# Patient Record
Sex: Female | Born: 1935 | Race: White | Hispanic: No | Smoking: Former smoker
Health system: Southern US, Community
[De-identification: ages and names within clinical notes are randomized; demographics above are authoritative.]

## PROBLEM LIST (undated history)

## (undated) DIAGNOSIS — F0390 Unspecified dementia without behavioral disturbance: Secondary | ICD-10-CM

## (undated) DIAGNOSIS — W19XXXA Unspecified fall, initial encounter: Secondary | ICD-10-CM

## (undated) DIAGNOSIS — D649 Anemia, unspecified: Secondary | ICD-10-CM

## (undated) DIAGNOSIS — G500 Trigeminal neuralgia: Secondary | ICD-10-CM

## (undated) DIAGNOSIS — R296 Repeated falls: Secondary | ICD-10-CM

## (undated) HISTORY — PX: VAGINAL HYSTERECTOMY: SUR661

## (undated) HISTORY — PX: TONSILLECTOMY: SUR1361

## (undated) HISTORY — PX: PARTIAL HIP ARTHROPLASTY: SHX733

## (undated) HISTORY — PX: APPENDECTOMY: SHX54

## (undated) HISTORY — DX: Anemia, unspecified: D64.9

## (undated) HISTORY — DX: Unspecified dementia, unspecified severity, without behavioral disturbance, psychotic disturbance, mood disturbance, and anxiety: F03.90

---

## 2004-07-14 ENCOUNTER — Ambulatory Visit: Payer: Self-pay | Admitting: Family Medicine

## 2006-11-22 ENCOUNTER — Ambulatory Visit: Payer: Self-pay | Admitting: Family Medicine

## 2007-12-07 ENCOUNTER — Ambulatory Visit: Payer: Self-pay | Admitting: Family Medicine

## 2007-12-09 ENCOUNTER — Ambulatory Visit: Payer: Self-pay | Admitting: Family Medicine

## 2008-12-10 ENCOUNTER — Ambulatory Visit: Payer: Self-pay | Admitting: Family Medicine

## 2010-06-13 ENCOUNTER — Ambulatory Visit: Payer: Self-pay | Admitting: Family Medicine

## 2011-05-04 ENCOUNTER — Inpatient Hospital Stay: Payer: Self-pay | Admitting: General Practice

## 2011-05-08 ENCOUNTER — Encounter: Payer: Self-pay | Admitting: Internal Medicine

## 2011-05-22 ENCOUNTER — Ambulatory Visit: Payer: Self-pay | Admitting: Internal Medicine

## 2011-05-30 ENCOUNTER — Encounter: Payer: Self-pay | Admitting: Internal Medicine

## 2011-12-16 ENCOUNTER — Ambulatory Visit: Payer: Self-pay | Admitting: Family Medicine

## 2012-08-02 IMAGING — CT CT HEAD WITHOUT CONTRAST
1 series · 16 of 29 positions shown, 20 images · non-contrast
Comparison: none

REASON FOR EXAM: Syncope
COMMENTS:

[Series 602: soft (id) · axial · 0.39mm/px · z∈[+654,+784]mm · 16 of 29 slices shown, 20 images]
[im 2/29  brain]
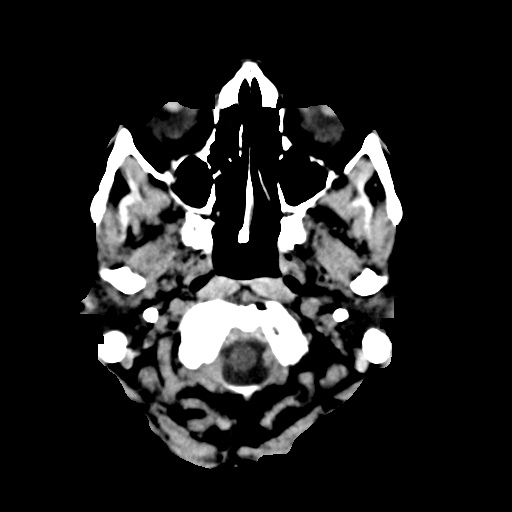
[im 2/29  bone]
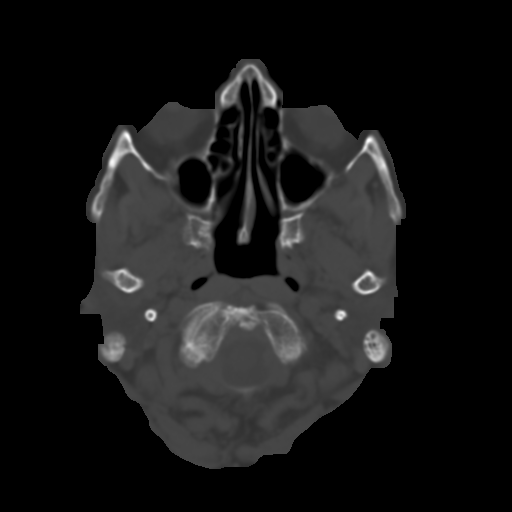
[im 4/29  brain]
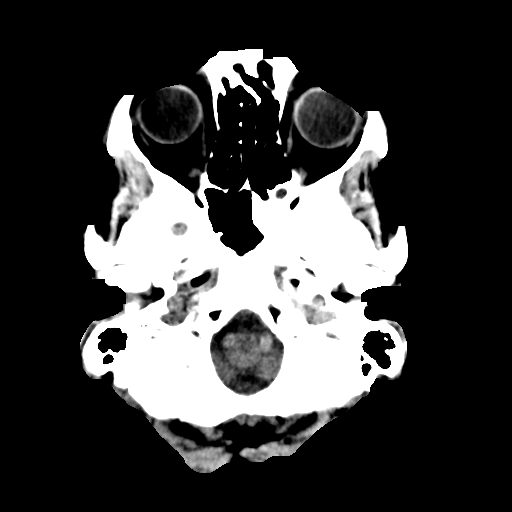
[im 6/29  brain]
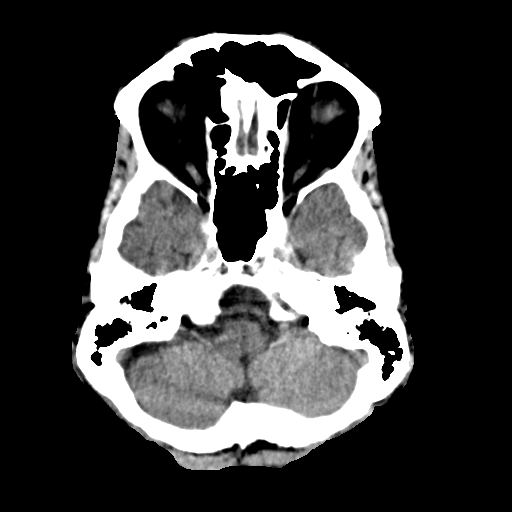
[im 7/29  brain]
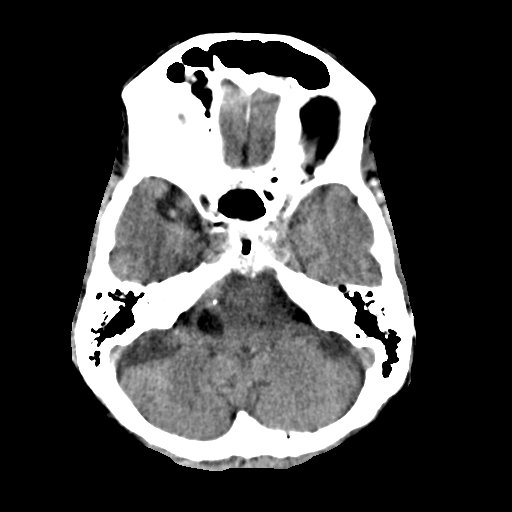
[im 9/29  brain]
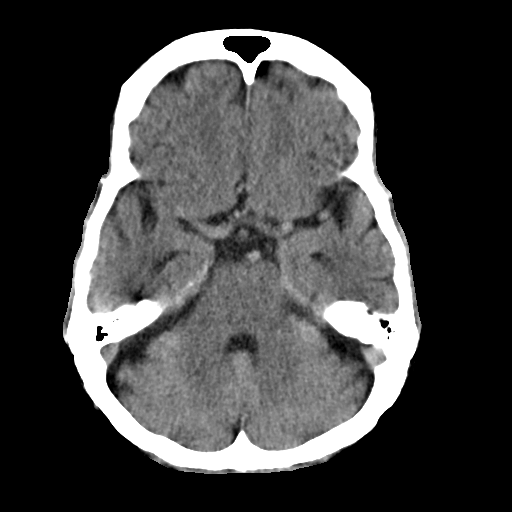
[im 9/29  bone]
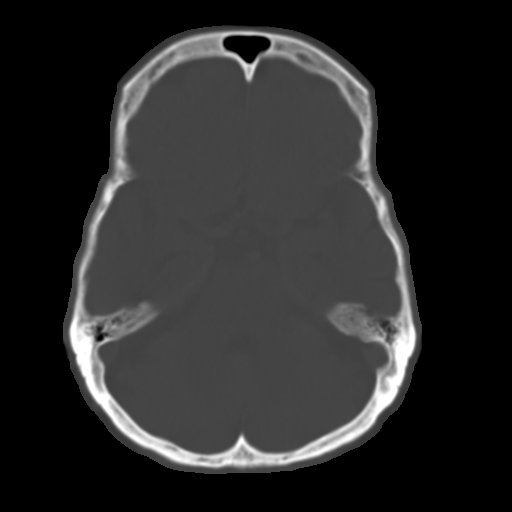
[im 11/29  brain]
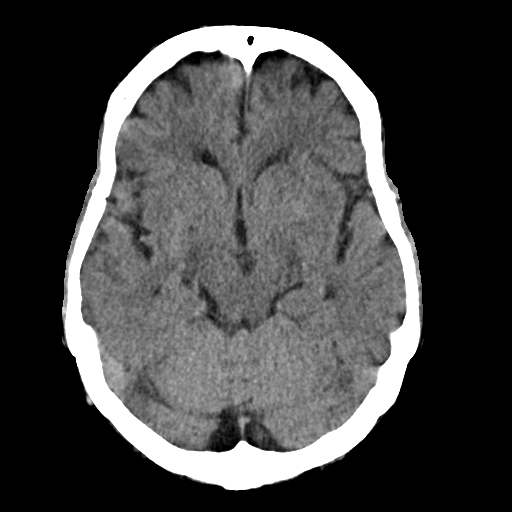
[im 12/29  brain]
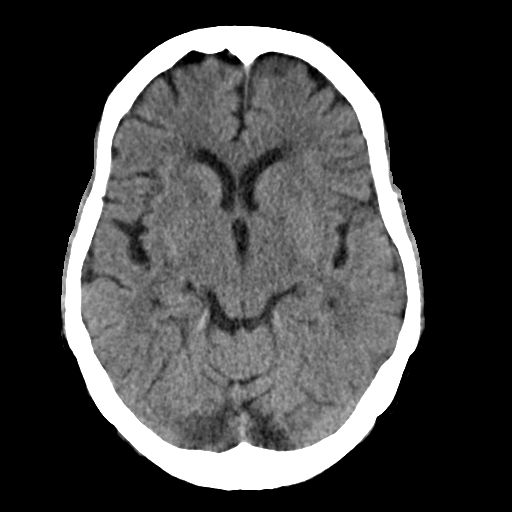
[im 14/29  brain]
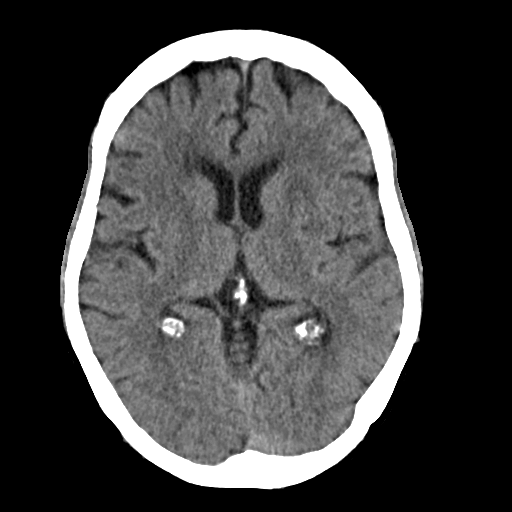
[im 16/29  brain]
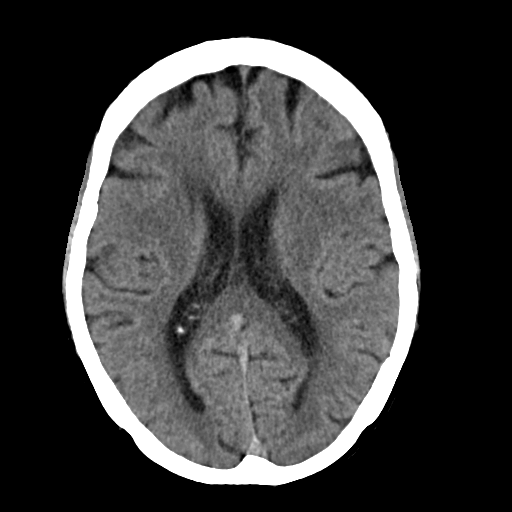
[im 16/29  bone]
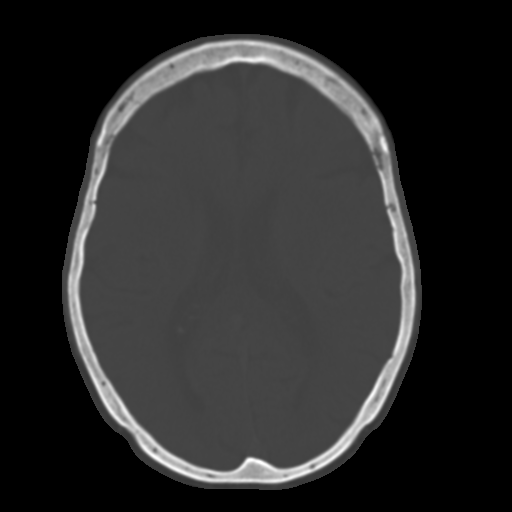
[im 18/29  brain]
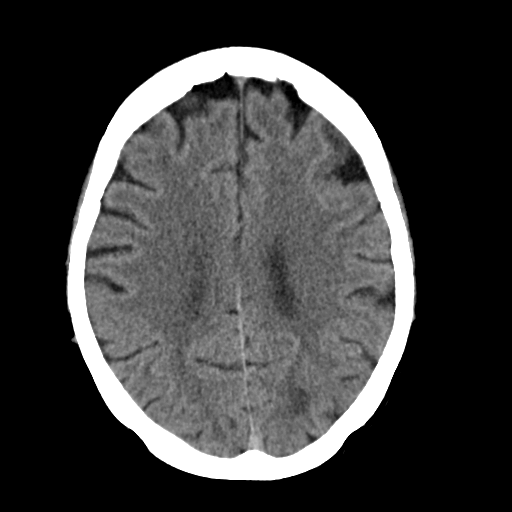
[im 19/29  brain]
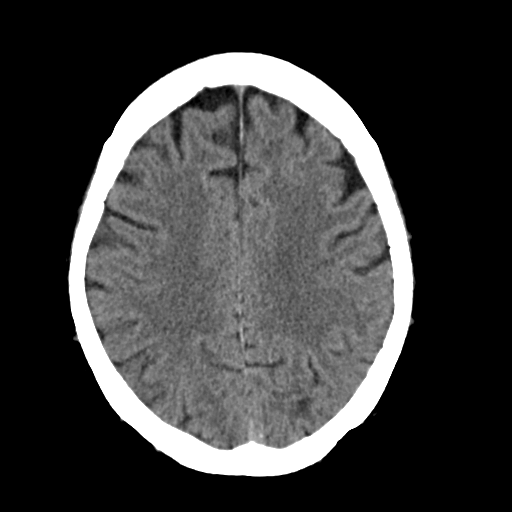
[im 21/29  brain]
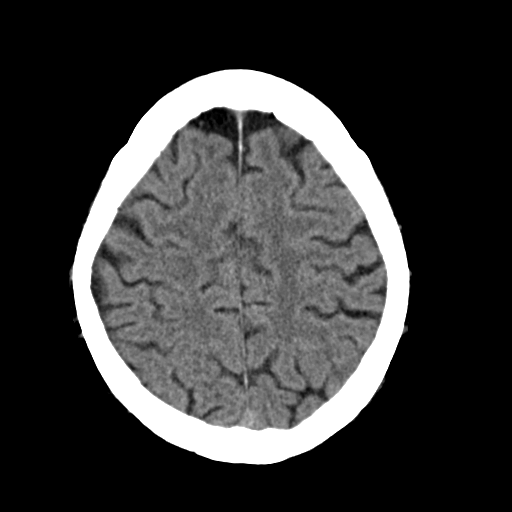
[im 23/29  brain]
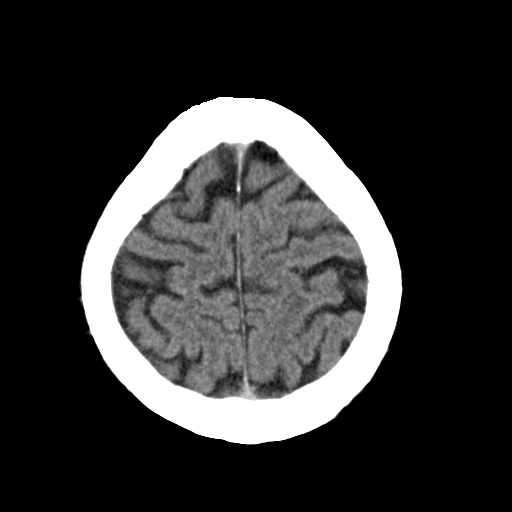
[im 23/29  bone]
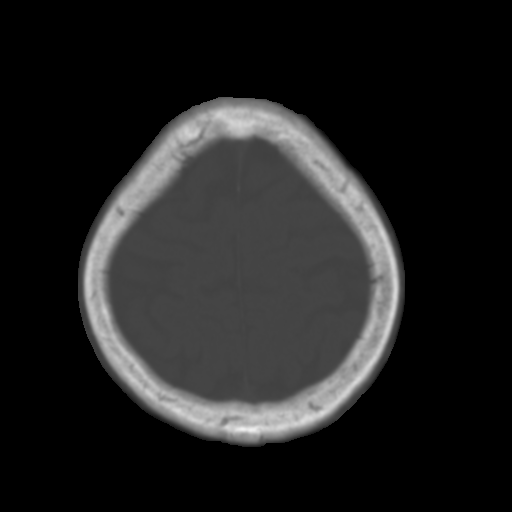
[im 24/29  brain]
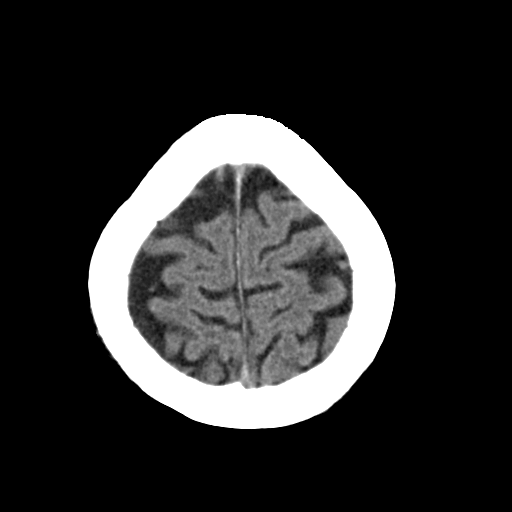
[im 26/29  brain]
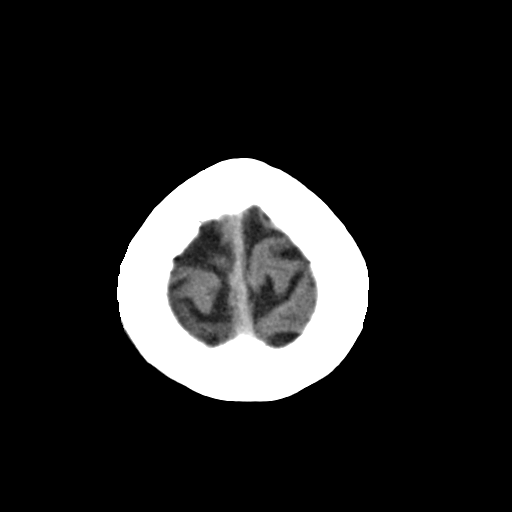
[im 28/29  brain]
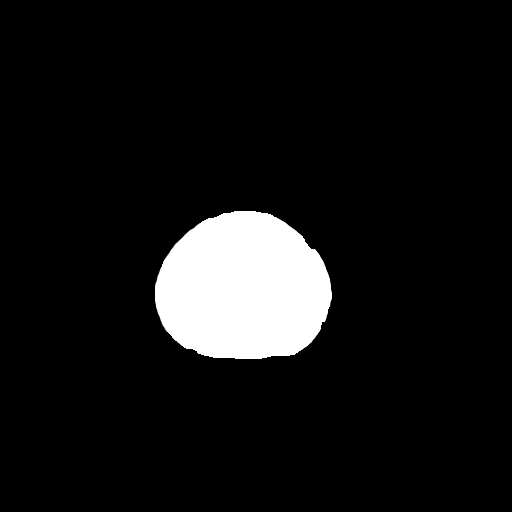

[16 of 29 positions shown; findings below may reference images not displayed]

PROCEDURE:     KARYN - ANN KATO WITHOUT CONTRAST  - December 16, 2011  [DATE]

RESULT:     CT of the brain without contrast is performed in the standard
fashion. Images demonstrate prominence of the ventricles and sulci
consistent with atrophy within normal limits for the patient's age.
Low-attenuation is seen in the periventricular and subcortical white matter
regions most consistent with chronic small vessel ischemic disease. There is
no evidence of intracranial hemorrhage, mass or mass effect. No midline
shift is evident. The included paranasal sinuses and mastoid air cells show
normal-appearing aeration. The calvarium is intact.
IMPRESSION: 1.     Changes of atrophy with chronic small vessel ischemic disease.
2.     No acute intracranial abnormality evident. MRI of the brain would be
recommended if the patient has persistent or worsening symptoms.

## 2012-10-13 ENCOUNTER — Ambulatory Visit: Payer: Self-pay

## 2012-10-23 ENCOUNTER — Ambulatory Visit: Payer: Self-pay

## 2013-05-09 LAB — URINALYSIS, COMPLETE
Hyaline Cast: 7
Nitrite: NEGATIVE
Ph: 5 (ref 4.5–8.0)
Protein: 30
Squamous Epithelial: 3

## 2013-05-09 LAB — COMPREHENSIVE METABOLIC PANEL
BUN: 38 mg/dL — ABNORMAL HIGH (ref 7–18)
Bilirubin,Total: 0.4 mg/dL (ref 0.2–1.0)
Calcium, Total: 9.3 mg/dL (ref 8.5–10.1)
Chloride: 108 mmol/L — ABNORMAL HIGH (ref 98–107)
Co2: 25 mmol/L (ref 21–32)
Creatinine: 0.82 mg/dL (ref 0.60–1.30)
EGFR (African American): >60
EGFR (Non-African Amer.): >60
Osmolality: 292 (ref 275–301)
Potassium: 4.3 mmol/L (ref 3.5–5.1)
SGPT (ALT): 18 U/L (ref 12–78)
Total Protein: 6.4 g/dL (ref 6.4–8.2)

## 2013-05-09 LAB — CBC
HCT: 38.2 % (ref 35.0–47.0)
MCH: 29.7 pg (ref 26.0–34.0)
MCHC: 34 g/dL (ref 32.0–36.0)
Platelet: 416 10*3/uL (ref 150–440)
RBC: 4.37 10*6/uL (ref 3.80–5.20)
RDW: 13 % (ref 11.5–14.5)

## 2013-05-10 ENCOUNTER — Inpatient Hospital Stay: Payer: Self-pay | Admitting: Internal Medicine

## 2013-05-10 LAB — HEMOGLOBIN: HGB: 10.2 g/dL — ABNORMAL LOW (ref 12.0–16.0)

## 2013-05-10 LAB — HEMATOCRIT
HCT: 29.6 % — ABNORMAL LOW (ref 35.0–47.0)
HCT: 25.7 % — ABNORMAL LOW (ref 35.0–47.0)

## 2013-05-10 LAB — PROTIME-INR: INR: 1.1

## 2013-05-11 LAB — CBC WITH DIFFERENTIAL/PLATELET
Basophil #: 0.1 10*3/uL (ref 0.0–0.1)
Basophil #: 0.1 10*3/uL (ref 0.0–0.1)
Basophil %: 0.4 %
Eosinophil #: 0.5 10*3/uL (ref 0.0–0.7)
Eosinophil #: 0.4 10*3/uL (ref 0.0–0.7)
Eosinophil %: 4.4 %
Eosinophil %: 3.5 %
HCT: 23.4 % — ABNORMAL LOW (ref 35.0–47.0)
HCT: 19.5 % — ABNORMAL LOW (ref 35.0–47.0)
HGB: 6.6 g/dL — ABNORMAL LOW (ref 12.0–16.0)
Lymphocyte #: 3.4 10*3/uL (ref 1.0–3.6)
Lymphocyte %: 24.9 %
MCH: 30 pg (ref 26.0–34.0)
MCH: 29.9 pg (ref 26.0–34.0)
MCHC: 34.4 g/dL (ref 32.0–36.0)
MCHC: 34 g/dL (ref 32.0–36.0)
MCV: 88 fL (ref 80–100)
Monocyte #: 1.3 x10 3/mm — ABNORMAL HIGH (ref 0.2–0.9)
Neutrophil #: 7.9 10*3/uL — ABNORMAL HIGH (ref 1.4–6.5)
Neutrophil %: 61.3 %
Platelet: 317 10*3/uL (ref 150–440)
Platelet: 288 10*3/uL (ref 150–440)
RBC: 2.68 10*6/uL — ABNORMAL LOW (ref 3.80–5.20)
RBC: 2.22 10*6/uL — ABNORMAL LOW (ref 3.80–5.20)
WBC: 12 10*3/uL — ABNORMAL HIGH (ref 3.6–11.0)

## 2013-05-11 LAB — PROTIME-INR
INR: 1.2
Prothrombin Time: 15.3 secs — ABNORMAL HIGH (ref 11.5–14.7)

## 2013-05-11 LAB — BASIC METABOLIC PANEL
Anion Gap: 6 — ABNORMAL LOW (ref 7–16)
Calcium, Total: 8.2 mg/dL — ABNORMAL LOW (ref 8.5–10.1)
Co2: 25 mmol/L (ref 21–32)
Creatinine: 0.64 mg/dL (ref 0.60–1.30)
EGFR (African American): >60
EGFR (Non-African Amer.): >60
Glucose: 104 mg/dL — ABNORMAL HIGH (ref 65–99)
Osmolality: 294 (ref 275–301)
Potassium: 3.9 mmol/L (ref 3.5–5.1)

## 2013-05-11 LAB — MAGNESIUM: Magnesium: 1.7 mg/dL — ABNORMAL LOW

## 2013-05-11 LAB — TSH: Thyroid Stimulating Horm: <0.01 u[IU]/mL — ABNORMAL LOW

## 2013-05-11 LAB — T4, FREE: Free Thyroxine: 1.54 ng/dL — ABNORMAL HIGH (ref 0.76–1.46)

## 2013-05-11 LAB — HEMOGLOBIN: HGB: 7.2 g/dL — ABNORMAL LOW (ref 12.0–16.0)

## 2013-05-12 LAB — CBC WITH DIFFERENTIAL/PLATELET
Basophil #: 0.1 10*3/uL (ref 0.0–0.1)
Eosinophil #: 0.9 10*3/uL — ABNORMAL HIGH (ref 0.0–0.7)
Eosinophil %: 7.7 %
HCT: 24.6 % — ABNORMAL LOW (ref 35.0–47.0)
Lymphocyte #: 3.2 10*3/uL (ref 1.0–3.6)
MCH: 30.8 pg (ref 26.0–34.0)
MCHC: 35.3 g/dL (ref 32.0–36.0)
MCV: 87 fL (ref 80–100)
Neutrophil %: 55.8 %
Platelet: 214 10*3/uL (ref 150–440)
RBC: 2.82 10*6/uL — ABNORMAL LOW (ref 3.80–5.20)
RDW: 13.6 % (ref 11.5–14.5)
WBC: 12.1 10*3/uL — ABNORMAL HIGH (ref 3.6–11.0)

## 2013-05-12 LAB — MAGNESIUM: Magnesium: 1.7 mg/dL — ABNORMAL LOW

## 2013-05-12 LAB — HEMATOCRIT
HCT: 22.1 % — ABNORMAL LOW (ref 35.0–47.0)
HCT: 20.3 % — ABNORMAL LOW (ref 35.0–47.0)

## 2013-05-12 LAB — HEMOGLOBIN
HGB: 8.4 g/dL — ABNORMAL LOW (ref 12.0–16.0)
HGB: 7.8 g/dL — ABNORMAL LOW (ref 12.0–16.0)

## 2013-05-13 LAB — HEMATOCRIT: HCT: 17.9 % — ABNORMAL LOW (ref 35.0–47.0)

## 2013-05-13 LAB — URINE CULTURE

## 2013-05-14 LAB — CBC WITH DIFFERENTIAL/PLATELET
Basophil #: 0.1 10*3/uL (ref 0.0–0.1)
Eosinophil %: 5.1 %
HGB: 8.4 g/dL — ABNORMAL LOW (ref 12.0–16.0)
Lymphocyte #: 2 10*3/uL (ref 1.0–3.6)
Lymphocyte %: 16.2 %
MCHC: 35.3 g/dL (ref 32.0–36.0)
MCV: 90 fL (ref 80–100)
Monocyte #: 1.2 x10 3/mm — ABNORMAL HIGH (ref 0.2–0.9)
Neutrophil %: 68.5 %
Platelet: 209 10*3/uL (ref 150–440)
RBC: 2.65 10*6/uL — ABNORMAL LOW (ref 3.80–5.20)
RDW: 14 % (ref 11.5–14.5)
WBC: 12.5 10*3/uL — ABNORMAL HIGH (ref 3.6–11.0)

## 2013-05-14 LAB — BASIC METABOLIC PANEL
Anion Gap: 5 — ABNORMAL LOW (ref 7–16)
BUN: 21 mg/dL — ABNORMAL HIGH (ref 7–18)
Calcium, Total: 7.6 mg/dL — ABNORMAL LOW (ref 8.5–10.1)
EGFR (African American): >60
EGFR (Non-African Amer.): >60
Osmolality: 287 (ref 275–301)
Potassium: 3.2 mmol/L — ABNORMAL LOW (ref 3.5–5.1)
Sodium: 142 mmol/L (ref 136–145)

## 2013-05-14 LAB — MAGNESIUM: Magnesium: 1.7 mg/dL — ABNORMAL LOW

## 2013-05-15 LAB — CBC WITH DIFFERENTIAL/PLATELET
Basophil #: 0 10*3/uL (ref 0.0–0.1)
Basophil #: 0 10*3/uL (ref 0.0–0.1)
Basophil %: 0.2 %
Eosinophil %: 4.3 %
Eosinophil %: 1.8 %
HGB: 7.9 g/dL — ABNORMAL LOW (ref 12.0–16.0)
Lymphocyte #: 1.7 10*3/uL (ref 1.0–3.6)
Lymphocyte %: 14.1 %
MCH: 31.7 pg (ref 26.0–34.0)
MCH: 31.6 pg (ref 26.0–34.0)
MCV: 89 fL (ref 80–100)
MCV: 90 fL (ref 80–100)
Monocyte #: 1.2 x10 3/mm — ABNORMAL HIGH (ref 0.2–0.9)
Monocyte %: 9.8 %
Neutrophil #: 7.7 10*3/uL — ABNORMAL HIGH (ref 1.4–6.5)
Neutrophil %: 69.4 %
Neutrophil %: 74.1 %
RBC: 2.52 10*6/uL — ABNORMAL LOW (ref 3.80–5.20)
RDW: 14.5 % (ref 11.5–14.5)
WBC: 11 10*3/uL (ref 3.6–11.0)

## 2013-05-15 LAB — HEPATIC FUNCTION PANEL A (ARMC)
Albumin: 2.1 g/dL — ABNORMAL LOW (ref 3.4–5.0)
Alkaline Phosphatase: 69 U/L (ref 50–136)
Bilirubin,Total: 0.7 mg/dL (ref 0.2–1.0)
SGOT(AST): 15 U/L (ref 15–37)
SGPT (ALT): 11 U/L — ABNORMAL LOW (ref 12–78)
Total Protein: 4.1 g/dL — ABNORMAL LOW (ref 6.4–8.2)

## 2013-05-15 LAB — T4, FREE: Free Thyroxine: 1.58 ng/dL — ABNORMAL HIGH (ref 0.76–1.46)

## 2013-05-16 LAB — BASIC METABOLIC PANEL
Anion Gap: 7 (ref 7–16)
BUN: 9 mg/dL (ref 7–18)
Calcium, Total: 7.9 mg/dL — ABNORMAL LOW (ref 8.5–10.1)
Chloride: 112 mmol/L — ABNORMAL HIGH (ref 98–107)
Co2: 24 mmol/L (ref 21–32)
Creatinine: 0.49 mg/dL — ABNORMAL LOW (ref 0.60–1.30)
EGFR (African American): >60
Osmolality: 284 (ref 275–301)
Potassium: 3.2 mmol/L — ABNORMAL LOW (ref 3.5–5.1)
Sodium: 143 mmol/L (ref 136–145)

## 2013-05-17 LAB — HEMOGLOBIN
HGB: 7.3 g/dL — ABNORMAL LOW (ref 12.0–16.0)
HGB: 6.8 g/dL — ABNORMAL LOW

## 2013-05-17 LAB — BASIC METABOLIC PANEL
BUN: 10 mg/dL (ref 7–18)
Co2: 28 mmol/L (ref 21–32)
Sodium: 143 mmol/L (ref 136–145)

## 2013-05-18 ENCOUNTER — Ambulatory Visit: Payer: Self-pay | Admitting: Hematology and Oncology

## 2013-05-18 LAB — HEMOGLOBIN: HGB: 7.9 g/dL — ABNORMAL LOW (ref 12.0–16.0)

## 2013-05-18 LAB — T4, FREE: Free Thyroxine: 1.23 ng/dL (ref 0.76–1.46)

## 2013-08-01 ENCOUNTER — Ambulatory Visit: Payer: Self-pay

## 2013-08-01 LAB — HEPATIC FUNCTION PANEL A (ARMC)
Albumin: 3.5 g/dL (ref 3.4–5.0)
Alkaline Phosphatase: 124 U/L (ref 50–136)
Bilirubin,Total: 0.3 mg/dL (ref 0.2–1.0)
SGOT(AST): 17 U/L (ref 15–37)

## 2013-08-01 LAB — CBC WITH DIFFERENTIAL/PLATELET
Basophil #: 0 10*3/uL (ref 0.0–0.1)
Eosinophil #: 0.5 10*3/uL (ref 0.0–0.7)
Eosinophil %: 7.4 %
HCT: 44.7 % (ref 35.0–47.0)
HGB: 14.2 g/dL (ref 12.0–16.0)
Lymphocyte #: 1.7 10*3/uL (ref 1.0–3.6)
MCHC: 31.8 g/dL — ABNORMAL LOW (ref 32.0–36.0)
MCV: 88 fL (ref 80–100)
Monocyte #: 0.7 x10 3/mm (ref 0.2–0.9)
Monocyte %: 10.7 %
Neutrophil #: 3.7 10*3/uL (ref 1.4–6.5)
Neutrophil %: 55.3 %
Platelet: 319 10*3/uL (ref 150–440)
RBC: 5.06 10*6/uL (ref 3.80–5.20)

## 2013-08-01 LAB — T4, FREE: Free Thyroxine: 0.53 ng/dL — ABNORMAL LOW (ref 0.76–1.46)

## 2013-08-01 LAB — TSH: Thyroid Stimulating Horm: 20.4 u[IU]/mL — ABNORMAL HIGH

## 2015-01-18 NOTE — Consult Note (Signed)
Brief Consult Note: Diagnosis: Microcytic anemia secondary to GI Bleed.   Discussed with Attending MD.   Comments: Patient presented with hematemesis. Hb still < 8g/dl despite 4 units packed red cells. Concern for Gi Bleed. Would recommend colonoscopy to further evaluate bleeding. Iv Feraheme today. Would not plan on hematology evaluation unless colonoscopy negative.  Electronic Signatures: Antony Hasteamiah, Silvestre Mines S (MD)  (Signed 21-Aug-14 12:44)  Authored: Brief Consult Note   Last Updated: 21-Aug-14 12:44 by Antony Hasteamiah, Mychele Seyller S (MD)

## 2015-01-18 NOTE — Consult Note (Signed)
Chief Complaint:  Subjective/Chief Complaint Patient a drop in Hb and dark stools last night. no complaints. She had an EGD without a source found.   VITAL SIGNS/ANCILLARY NOTES: **Vital Signs.:   16-Aug-14 04:00  Vital Signs Type Routine  Pulse source if not from Vital Sign Device per cardiac monitor    09:35  Pulse source if not from Vital Sign Device per cardiac monitor   Brief Assessment:  GEN well developed, well nourished, no acute distress   Respiratory normal resp effort   Additional Physical Exam Alert and orientated times 3   Lab Results: Thyroid:  16-Aug-14 03:50   Thyroxine, Free  1.52 (Result(s) reported on 13 May 2013 at 04:42AM.)  Routine Hem:  16-Aug-14 03:50   Hematocrit (CBC)  17.9 (Result(s) reported on 13 May 2013 at 04:31AM.)  Hemoglobin (CBC)  6.3 (Result(s) reported on 13 May 2013 at 04:31AM.)   Assessment/Plan:  Assessment/Plan:  Assessment GI bleed.   Plan The patient will be set up for a repeat bleeding scan for today. She is being transfused at present.   Electronic Signatures: Midge MiniumWohl, Tisa Weisel (MD)  (Signed 16-Aug-14 11:33)  Authored: Chief Complaint, VITAL SIGNS/ANCILLARY NOTES, Brief Assessment, Lab Results, Assessment/Plan   Last Updated: 16-Aug-14 11:33 by Midge MiniumWohl, Damyia Strider (MD)

## 2015-01-18 NOTE — Discharge Summary (Signed)
PATIENT NAME:  Victoria Woods, Victoria Woods MR#:  [id number] DATE OF BIRTH:  1920/11/05  DATE OF ADMISSION:  05/10/2013 DATE OF DISCHARGE:  05/18/2013  Addendum to discharge summary dictated by Dr. Shaune PollackQing Chen on 05/16/2013.   FINAL DISCHARGE DIAGNOSES: 1.  Anemia, acute on chronic status post 4 units blood transfusion suspected from gastrointestinal bleed, etiology unclear.  2.  Senile dementia.  3.  Hyperthyroidism.   PROCEDURES:  1.  EGD negative.  2.  GI bleeding scan x2 negative for any active bleed.   CODE STATUS: NO CODE, DO NOT RESUSCITATE.   MEDICATIONS: 1.  Aricept 10 mg at bedtime.  2. ensure  p.o. daily.  3.  Methimazole 10 mg 2 tablets daily.  4.  Iron polysaccharide 150 mg p.o. daily.  5.  Protonix 40 mg daily.  6.  Ensure Clear 200 mL 3 times a day with meals.  7.  The patient advised not to take aspirin, Celebrex. Do not use Motrin or ibuprofen. Use Tylenol for pain.  8.  Home health physical therapy.  9.  Mechanical soft diet.   FOLLOWUP:  1.  Follow up with Dr. Shelle Ironein GI in 1 week.  2.  Follow up with Dr. Wendie Simmeramiah on 05/26/2013 at 2:00.  Hemoglobin at discharge was 7.9.   HOSPITAL COURSE:  From 05/17/2013 to 05/18/2013.  Victoria Woods is an 81 year old Caucasian female with history of dementia, brought in with:  1.  Acute on chronic anemia, which is suspected due to slow GI bleed. The patient did not have any active bleeding. She underwent endoscopy done by Dr. Shelle Ironein, did not show any abnormality.  The patient was continued on Protonix. She received 4 units of blood transfusion. Her exact etiology could not be determined. GI wants to hold off on colonoscopy due to age, dementia and other comorbidities and the patient did not have any active rectal bleed. She will be started on p.o. iron and follow up with Dr. Shelle Ironein in 1 week. If the patient's hemoglobin continues to stay on the lower side, GI may consider doing the colonoscopy as outpt. The patient was seen by Dr. Wendie Simmeramiah from  hematology.  Received IV Venofer for her anemia. She will also be followed by Dr. Wendie Simmeramiah as outpatient.  2.  Relative hypotension. Blood pressure was on the lower side, however, the patient remained asymptomatic.  3.  Hyperthyroidism. The patient was seen by Dr. Tedd SiasSolum. Her methimazole, she received a dose of 30 mg, and then 20 mg daily was given. She will follow up with Dr. Tedd SiasSolum as outpatient as well.  4.  History of chronic irregular heart rate. The patient remained in sinus rhythm.  5.  Senile dementia. The patient was resumed back on her Aricept. Discharge plan was discussed with the patient's daughter, Victoria Woods at length.   6.  Generalized weakness and debility. The patient was seen by physical therapy. Home health PT was recommended, which had been set up prior to discharge. Hospital stay otherwise remained stable.   CODE STATUS: The patient remained NO CODE, DO NOT RESUSCITATE.   TIME SPENT: 40 minutes.   ____________________________ Wylie HailSona A. Allena KatzPatel, MD sap:dp D: 05/19/2013 07:25:20 ET T: 05/19/2013 07:46:39 ET JOB#: [id number]  cc: Rada Zegers A. Allena KatzPatel, MD, <Dictator> Willow OraSONA A Dwon Sky MD ELECTRONICALLY SIGNED 06/01/2013 20:03

## 2015-01-18 NOTE — Consult Note (Signed)
Brief Consult Note: Diagnosis: upper GI bleed.   Patient was seen by consultant.   Consult note dictated.   Recommend to proceed with surgery or procedure.   Comments: Ms Larence PenningBullard had melena for 2 days, hematemesis x 1.   Her hgb dropped overnight, but her hemodynamics are stable.   Please cont PPI drip.  We will plan for EGD to investigate the source of the bleeding..  Electronic Signatures: Dow Adolphein, Matthew (MD)  (Signed 13-Aug-14 12:44)  Authored: Brief Consult Note   Last Updated: 13-Aug-14 12:44 by Dow Adolphein, Matthew (MD)

## 2015-01-18 NOTE — Consult Note (Signed)
PATIENT NAME:  Victoria Woods, Victoria Woods MR#:  [id number] DATE OF BIRTH:  17-Feb-1921  DATE OF CONSULTATION:  05/12/2013  REFERRING PHYSICIAN:  Max Sane, MD CONSULTING PHYSICIAN:  A. Lavone Orn, MD  CHIEF COMPLAINT: Low TSH.   HISTORY OF PRESENT ILLNESS: This is an 81 year old female seen in consultation at the request of Dr. Manuella Ghazi for abnormal thyroid function tests, specifically with a low TSH of 0.010 and an elevated free T4 of 1.54. The patient was hospitalized on 08/13 with complaints of melena. She was noted to be tachycardic with a pulse of 112 and had an initially normal hemoglobin, although after hydration it decreased to 8. She has undergone blood transfusion, upper endoscopy, as well as a tagged red blood cell study. She has no known history of thyroid disease. She denies a family history of thyroid disease. She denies any complaints of feeling unwell at this time. She denies neck pain. No fever. No heat intolerance. Denies heart racing or palpitations. Denies tremor. Denies any weight loss. One daughter is at the bedside and this daughter is from [place]. She reports that mother is on Aricept for memory so I am not sure if she is a good historian.  PAST MEDICAL HISTORY: 1.  Possible dementia.  2.  Reported B12 deficiency.   PAST SURGICAL HISTORY: 1.  Tonsillectomy. 2.  Appendectomy. 3.  Hysterectomy.   ALLERGIES: No known drug allergies.   CURRENT INPATIENT MEDICATIONS:  1.  Protonix IV drip.  2.  Ciprofloxacin 250 mg q. 12 hours.  SOCIAL HISTORY: The patient lives with a friend. Apparently no history of tobacco or alcohol use.   FAMILY HISTORY: No known thyroid disease.   REVIEW OF SYSTEMS: GENERAL: No weight loss. No fever.  HEENT: No blurred vision. No sore throat.  NECK: Denies neck pain. Denies dysphasia.  CARDIAC: Denies chest pain. Denies palpitation.  PULMONARY: Denies cough. Denies shortness of breath.  ABDOMEN: Denies abdominal pain. She had had melena as per  HPI. Denies nausea.  EXTREMITIES: Denies swelling. Denies weakness.  GENITOURINARY: Denies dysuria. Denies hematuria. ENDOCRINE: Denies heat or cold intolerance.  HEME: Denies easy bruisability.  SKIN: Denies rash or recent skin changes.   PHYSICAL EXAMINATION: VITAL SIGNS: Height 65 inches, weight 120 pounds, BMI 20.1.  Temp 98.6, pulse 110, respirations 21, blood pressure 98/54.  GENERAL: White female, elderly, no acute distress and cooperative.  HEENT: No proptosis. No lid lag. EOMI. Oropharynx is clear.  NECK: Supple. No appreciable goiter, no neck tenderness, no palpable thyroid nodules.  CARDIAC: No carotid bruit, tachycardic.  PULMONARY: Clear to auscultation bilaterally. No wheeze.  ABDOMEN: Diffusely soft, nontender. She does have a ventral hernia.  EXTREMITIES: No edema is present.  SKIN: No rash or dermatopathy is noted.  NEUROLOGIC: There is no tremor of the outstretched hands.  MUSCULOSKELETAL: No weakness was elicited.  LABORATORY DATA: 05/11/2013: Glucose 104, BUN 36, creatinine 0.6, chloride 112. EGFR greater than 60. Magnesium 1.7. TSH less than 0.010. Free T4 is normal at 3.7.  05/12/2013: Hemoglobin 7.8, hematocrit 22.1.   ASSESSMENT: An 81 year old female with upper gastrointestinal bleed, persistent tachycardia, anemia, and low TSH and elevated free T4. Thyroid labs are consistent with hyperthyroidism.  Causes of hyperthyroidism in this individual could include silent thyroiditis, which we see in the setting of acute illness, Graves hyperthyroidism, or less likely would be a toxic adenoma or toxic multinodular goiter.   RECOMMENDATIONS: 1.  We will repeat a free T4 tomorrow as well as obtain a thyrotropin receptor antibody  and total T3 level. In Graves hyperthyroidism would expect the free T4 and total T3 to be elevated and the thyrotropin receptor antibody to be elevated. So this would be helpful to elucidate the cause of her hyperthyroidism. If free T4 has improved  and T3 is not high, this may suggest more thyroiditis.  2.  Given persistent tachycardia, which again may be multifactorial and due to anemia, I will favor treating empirically now with methimazole. Will give 30 mg today and then 20 mg daily thereafter. Again, if labs do not support ongoing hyperthyroidism over the next couple of days, I may elect to DC this.  Thank you for the kind request for consultation. I will follow along with you.  ____________________________ A. Lavone Orn, MD ams:sb D: 05/12/2013 15:06:09 ET T: 05/12/2013 15:47:14 ET JOB#: [id number]  cc: A. Lavone Orn, MD, <Dictator> Sherlon Handing MD ELECTRONICALLY SIGNED 05/24/2013 16:36

## 2015-01-18 NOTE — Consult Note (Signed)
Chief Complaint:  Subjective/Chief Complaint The pateint has had only a small amount of stooling last night but it was black. The repeat bleeding scan was negative. The patient had a drop in Hb overnight and was transfused this am.   VITAL SIGNS/ANCILLARY NOTES: **Vital Signs.:   17-Aug-14 07:45  Vital Signs Type Routine  Temperature Source oral  Pulse source if not from Vital Sign Device per cardiac monitor  Systolic BP Systolic BP 90  Diastolic BP (mmHg) Diastolic BP (mmHg) 58  Mean BP 68  Pulse Ox % Pulse Ox % 96  Oxygen Delivery Room Air/ 21 %  Pulse Ox Heart Rate 102   Brief Assessment:  GEN well developed, well nourished, no acute distress   Respiratory normal resp effort   Additional Physical Exam Alert and orientated times 3   Assessment/Plan:  Assessment/Plan:  Assessment GI bleed. with drop in Hb.   Plan The patient has continued anemia with acute blood loss. The patient was transfused and her hb will be followed today. She may need to have a repeat EGD with push enteroscopy. Dr. Shelle Ironein will resume care tomorrow.   Electronic Signatures: Midge MiniumWohl, Estuardo Frisbee (MD)  (Signed 17-Aug-14 08:00)  Authored: Chief Complaint, VITAL SIGNS/ANCILLARY NOTES, Brief Assessment, Assessment/Plan   Last Updated: 17-Aug-14 08:00 by Midge MiniumWohl, Damarri Rampy (MD)

## 2015-01-18 NOTE — H&P (Signed)
PATIENT NAME:  Victoria Woods, Victoria Woods MR#:  [id number] DATE OF BIRTH:  01/23/1921  DATE OF ADMISSION:  05/10/2013  PRIMARY CARE PHYSICIAN:  Dr. Elizabeth Sauereanna Jones.   REFERRING PHYSICIAN:  Dr. Zenda AlpersWebster.   CHIEF COMPLAINT:  Vomiting blood and black tarry stool.  HISTORY OF PRESENT ILLNESS:  The patient is an 81 year old Caucasian female apparently who is healthy, has been taking Celebrex for the past 9 to 10 days regarding degenerative joint disease and then noticed black tarry stool 3 to 5 times yesterday and started having vomiting blood.  Denies any kind of abdominal pain.  The patient and her friend are concerned and she is brought into the ER.  Hemoglobin is stable at 13 with a hematocrit of 38.3.  The patient is initiated on Protonix GTT.  The patient is tachycardic, initially hypotensive, but with IV fluids blood pressure is stabilized to 116/58.  During my examination, the patient denies any complaints.  Denies any abdominal pain.  Feeling okay.  Daughter and the patient's friend are at bedside.   PAST MEDICAL HISTORY:  Vitamin B12 deficiency.  Irregular heartbeat.   PAST SURGICAL HISTORY:  Tonsillectomy, appendectomy, hysterectomy.  ALLERGIES:  No known drug allergies.   PSYCHOSOCIAL HISTORY:  Lives at home with her friend.  Denies any history of smoking.  Occasional alcohol drinking.  Denies any street drugs.   FAMILY HISTORY:  Father deceased in motor vehicle accident.  Mom was healthy.   VITAL SIGNS:  Temperature 97.5 with pulse 106, respirations 18, blood pressure 116/58.   HOME MEDICATIONS:  Celebrex orally as needed, aspirin 325 mg once daily, Aricept 10 mg once daily.   REVIEW OF SYSTEMS: CONSTITUTIONAL:  Denies fever, fatigue.  EYES:  Denies blurry vision, glaucoma.  EARS, NOSE, THROAT:  No epistaxis, tinnitus.  RESPIRATION:  Denies cough, COPD.  CARDIOVASCULAR:  No chest pain, palpitations.  GASTROINTESTINAL:  Complaining of hematemesis, black tarry stool, but no abdominal pain.   GENITOURINARY:  No dysuria, hematuria.  GYNECOLOGIC AND BREAST:  Denies breast mass or vaginal discharge.  ENDOCRINE:  No polyuria, nocturia, thyroid problems.  HEMATOLOGIC AND LYMPHATIC:  No anemia or easy bruising.  Has hematemesis and hematochezia.  INTEGUMENTARY:  No acne, rash, lesions.  MUSCULOSKELETAL:  No joint pain in the neck.  Denies gout.  NEUROLOGIC:  No history of vertigo or ataxia.  PSYCHIATRIC:  No ADD, OCD.   PHYSICAL EXAMINATION: VITAL SIGNS:  Temperature 97.5, pulse 112, respirations 18, blood pressure 113/74, pulse ox 94%.  GENERAL APPEARANCE:  Not under acute distress.  Moderately built and nourished.  HEENT:  Normocephalic, atraumatic.  Pupils are equal, reacting light and accommodation.  No scleral icterus.  No conjunctival injection.  No sinus tenderness.  No postnasal drip.  NECK:  Supple.  No JVD.  No thyromegaly.  No lymphadenopathy.  Range of motion is intact.  LUNGS:  Clear to auscultation bilaterally.  No accessory muscle usage.  No anterior chest wall tenderness on palpation.  CARDIAC:  S1, S2 normal.  Regular rate and rhythm, tachycardic.  GASTROINTESTINAL:  Soft.  Bowel sounds are positive in all four quadrants.  Nontender, nondistended.  No hepatosplenomegaly.  No rebound tenderness.  NEUROLOGIC:  Awake, alert, oriented x 3.  Motor and sensory grossly intact.  Reflexes are 2+.  Cranial nerves II through XII are grossly intact.  EXTREMITIES:  No edema.  No cyanosis.  No clubbing.  SKIN:  Warm to touch.  Normal turgor.  No rashes.  No lesions.   PSYCHIATRIC:  Normal  mood and affect.  RECTAL:  Deferred as it was done by the ER physician and as reported by her.  The patient has heme-positive stool.   LABORATORY AND IMAGING STUDIES:  Glucose 127, BUN 38, creatinine 0.82, sodium 141, potassium 4.3, chloride 108, CO2 25, GFR greater than 60, serum osmolality 292, calcium 9.3, troponin less than 0.02.  WBC 14.1, hemoglobin 13.0, hematocrit 38.2, platelets of 416.   LFTs are within normal range except albumin which is low at 3.3.  Urinalysis yellow in color, hazy in appearance, leukocyte esterase 2 +, nitrite negative.   ASSESSMENT AND PLAN:  An 81 year old Caucasian female presenting to the ER with a chief complaint of vomiting blood and black tarry stool for the past one day, will be admitted with the following assessment and plan.  1.  Upper gastrointestinal bleed, probably from taking Celebrex continuously for the past 10 days.  We will keep her nothing by mouth, admit her to CCU.  We will provide her IV fluids, Protonix drip.  Gastroenterology consult is placed to Dr. Dow Adolph, called and notified.  Typing and screening is done.  We will monitor hemoglobin and hematocrit q. 8 hours.  2.  Acute cystitis.  Urine culture is ordered and IV ciprofloxacin will be provided.  3.  Vitamin B12 deficiency.  The patient being nothing by mouth we will hold off on vitamin B12 supplements at this time.  4.  Chronic history of irregular heartbeat.  The patient is in sinus rhythm now.  5.  We will provide her gastrointestinal prophylaxis with Protonix drip and deep vein thrombosis prophylaxis with SCDs.   Diagnosis and plan of care was discussed in detail with the patient and her daughter at bedside.  They both verbalized understanding of the plan.   CODE STATUS:  She is FULL CODE.  Daughter is medical power of attorney.   Critical care time spent is 50 minutes.    ____________________________ Ramonita Lab, MD ag:ea D: 05/10/2013 05:36:38 ET T: 05/10/2013 06:01:17 ET JOB#: [id number]  cc: Ramonita Lab, MD, <Dictator> Dow Adolph, MD Duanne Limerick, MD  Ramonita Lab MD ELECTRONICALLY SIGNED 05/12/2013 6:49

## 2015-01-18 NOTE — Consult Note (Signed)
PATIENT NAME:  Victoria Woods, Victoria Woods MR#:  [id number] DATE OF BIRTH:  1920-11-07  DATE OF CONSULTATION:  05/10/2013  REFERRING PHYSICIAN:  Dr. Ramonita Lab CONSULTING PHYSICIAN:  Dow Adolph, MD  REASON FOR THE CONSULT: Melena, hematemesis.   HISTORY OF PRESENT ILLNESS: Victoria Woods is an 81 year old female with a past medical history notable for B12 deficiency who presented to the Emergency Room for two days of melena and one episode of hematemesis. This this history is both per Victoria Woods and per the caretaker that lives with her. Over the past two days,  she has had about 2 to 3 episodes of dark stool episodes per day. Also last night before coming to the Emergency Room, she had one episode of coffee ground emesis. This was what prompted them to come to the Emergency Room. She has not had any prior trouble with GI bleeding. She has never had an upper endoscopy. She does not have any trouble with heartburn or reflux. Of note, she was has been taking Celebrex for joint pain over the approximately the past 10 days.   In the Emergency Room, she was noted to be mildly tachycardic, but responded to IV fluids. She was noted to have a normal hemoglobin of 13, which did drop significantly to  10 this morning.   PAST MEDICAL HISTORY:  1.  B12 deficiency.  2.  Irregular heartbeat.   PAST SURGICAL HISTORY:  1.  Tonsillectomy.  2.  Appendectomy.  3.  Hysterectomy.   ALLERGIES:  NKDA.   HOME MEDICATIONS: Celebrex.   SOCIAL HISTORY: She denies any history of tobacco, alcohol or recreational drugs.   FAMILY HISTORY: She denies any family history of colon cancer or any GI malignancy.   REVIEW OF SYSTEMS:   CONSTITUTIONAL: No weight gain or weight loss.  No fever or chills. HEENT: No oral lesions or sore throat. No vision changes. GASTROINTESTINAL: See HPI.  HEME/LYMPH: No easy bruising or bleeding. CARDIOVASCULAR: No chest pain or dyspnea on exertion. GENITOURINARY: No hematuria. INTEGUMENTARY: No  rashes or pruritus PSYCHIATRIC: No depression/anxiety.  ENDOCRINE: No heat/cold intolerance, no hair loss or skin changes. ALLERGIC/IMMUNOLOGIC: Negative for hives. RESPIRATORY: No cough, no shortness of breath.  MUSCULOSKELETAL: No joint swelling or muscle pain.   PHYSICAL EXAMINATION:   VITAL SIGNS:  Temperature is 97.9, pulse is 106, respirations are 18, blood pressure is 123/56. She is 97% on room air.  GENERAL: Alert and oriented times 2.  No acute distress. Appears stated age. HEENT: Normocephalic/atraumatic. Extraocular movements are intact. Anicteric. NECK: Soft, supple. JVP appears normal. No adenopathy. CHEST: Clear to auscultation. No wheeze or crackle. Respirations unlabored. HEART: Regular. No murmur, rub or gallop.  Normal S1 and S2. ABDOMEN: Soft, nontender, nondistended.  Normal active bowel sounds in all four quadrants.  No organomegaly. No masses EXTREMITIES: No swelling, well perfused. SKIN: No rash or lesion. Skin color, texture, turgor normal. NEUROLOGICAL: Grossly intact. PSYCHIATRIC: Normal tone and affect. MUSCULOSKELETAL: No joint swelling or erythema.  RECTAL:  c/w melena   LABORATORY DATA:  Her sodium 141 potassium 4.3, chloride 108, bicarbonate 25, BUN 38, creatinine 0.82, albumin 3.3, total bilirubin 0.4, alkaline phosphatase 104, AST 19, ALT 18. White count is 14, hemoglobin 13, hematocrit 38, which has gone to 10 at 7:50 this morning and then 9 at 2:55. Her INR is 1.1. Her platelets are 416.   ASSESSMENT AND PLAN: 1.  Melena, hematemesis: It seems as though she has stabilized at that this time. Her hemodynamics are stable. However,  her hemoglobin is dropping, which may reflect previous blood loss and equilibration  with fluids. We will plan to perform in an upper endoscopy to investigate for the source of the bleeding and potentially for treatment options. In the meantime please continue a proton pump inhibitor drip. Please also continue to monitor  hemodynamics and hemoglobin. We will have further recommendations pending the findings on the upper endoscopy. Please also go ahead and check a H. pylori serology.    ____________________________ Dow AdolphMatthew Rein, MD mr:cc D: 05/10/2013 16:15:10 ET T: 05/10/2013 16:59:07 ET JOB#: [id number]  cc: Dow AdolphMatthew Rein, MD, <Dictator> Kathalene FramesMATTHEW G REIN MD ELECTRONICALLY SIGNED 05/12/2013 15:53

## 2015-01-18 NOTE — Consult Note (Signed)
Chief Complaint and History:  Primary Physician Dr. Elizabeth Sauereanna Jones   Referring Physician Dr. Sherryll BurgerShah   Chief Complaint Low TSH   Allergies:  No Known Allergies:   Assessment/Plan:  Assessment/Plan [age] yo F admitted for melena with presumed upper GI bleed noted to have persistent tachycardia, TSH low at <0.010 and high free T4 of 1.54. She has no known personal or family h/o thyroid disease. No h/o iodinated contrsst dye, exposure to lithium, amiodarone, or glucocorticoids. She denies neck pain and fever. Otherwise feels well and no: tremor, palpitations, heat intolerance, or weight loss. Exam notable for no proptosis or eye changes and no goiter or palpable thyroid nodules. She remains tachy with HR in the 115 range.  A/ Hyperthyroidism - acute thyroiditis versus Grave's hyperthyroidism versus toxic MNG or adenoma.  P/ Will check TrAb which tends to be elevated in Grave's Start methimazole- 30 mg now and then 20 mg qd Repeat free T4 and get total T3 tomorrow  Full consult will be dictated.   Case Discussed With patient, daughter   Electronic Signatures: Raj JanusSolum, Lunah Losasso M (MD)  (Signed 15-Aug-14 14:50)  Authored: Chief Complaint and History, ALLERGIES, Assessment/Plan   Last Updated: 15-Aug-14 14:50 by Raj JanusSolum, Joelene Barriere M (MD)

## 2015-07-17 ENCOUNTER — Other Ambulatory Visit: Payer: Self-pay | Admitting: Family Medicine

## 2015-08-19 ENCOUNTER — Other Ambulatory Visit: Payer: Self-pay | Admitting: Family Medicine

## 2015-09-02 ENCOUNTER — Other Ambulatory Visit: Payer: Self-pay

## 2015-09-16 ENCOUNTER — Ambulatory Visit (INDEPENDENT_AMBULATORY_CARE_PROVIDER_SITE_OTHER): Payer: Medicare Other | Admitting: Family Medicine

## 2015-09-16 ENCOUNTER — Ambulatory Visit
Admission: RE | Admit: 2015-09-16 | Discharge: 2015-09-16 | Disposition: A | Discharge status: Home / Self Care | Payer: Medicare Other | Source: Ambulatory Visit | Attending: Family Medicine | Admitting: Family Medicine

## 2015-09-16 VITALS — BP 120/62 | HR 80 | Temp 98.0°F | Ht 66.0 in | Wt 120.0 lb

## 2015-09-16 DIAGNOSIS — Z87891 Personal history of nicotine dependence: Secondary | ICD-10-CM | POA: Insufficient documentation

## 2015-09-16 DIAGNOSIS — J189 Pneumonia, unspecified organism: Secondary | ICD-10-CM | POA: Diagnosis present

## 2015-09-16 DIAGNOSIS — J181 Lobar pneumonia, unspecified organism: Principal | ICD-10-CM

## 2015-09-16 DIAGNOSIS — D509 Iron deficiency anemia, unspecified: Secondary | ICD-10-CM

## 2015-09-16 MED ORDER — LEVOFLOXACIN 500 MG PO TABS: 500.0000 mg | ORAL_TABLET | Freq: Every day | ORAL | Status: DC

## 2015-09-16 NOTE — Progress Notes (Signed)
Name: Victoria Woods   MRN: [id number]    DOB: 1920-12-15   Date:09/16/2015       Progress Note  Subjective  Chief Complaint  Chief Complaint  Patient presents with  . Anemia  . Dementia  . Sinusitis    cough and cong    Anemia Presents for follow-up visit. There has been no abdominal pain, anorexia, bruising/bleeding easily, confusion, fever, leg swelling, light-headedness, malaise/fatigue, pallor, palpitations, paresthesias, pica or weight loss. Signs of blood loss that are not present include hematochezia and melena. Past treatments include oral iron supplements. There is no history of heart failure or malnutrition.  Sinusitis This is a new problem. The current episode started in the past 7 days. There has been no fever. Associated symptoms include coughing. Pertinent negatives include no chills, diaphoresis, ear pain, headaches, hoarse voice, neck pain, shortness of breath, sinus pressure or sore throat. Past treatments include acetaminophen. The treatment provided no relief.  Cough This is a new problem. The current episode started in the past 7 days. The problem has been waxing and waning. The cough is non-productive. Associated symptoms include nasal congestion. Pertinent negatives include no chest pain, chills, ear pain, fever, headaches, heartburn, hemoptysis, myalgias, postnasal drip, rash, sore throat, shortness of breath, weight loss or wheezing. The treatment provided mild relief. There is no history of bronchiectasis, COPD or environmental allergies.    No problem-specific assessment & plan notes found for this encounter.   No past medical history on file.  No past surgical history on file.  No family history on file.  Social History   Social History  . Marital Status: Widowed    Spouse Name: N/A  . Number of Children: N/A  . Years of Education: N/A   Occupational History  . Not on file.   Social History Main Topics  . Smoking status: Not on file  .  Smokeless tobacco: Not on file  . Alcohol Use: Not on file  . Drug Use: Not on file  . Sexual Activity: Not on file   Other Topics Concern  . Not on file   Social History Narrative  . No narrative on file    No Known Allergies   Review of Systems  Constitutional: Negative for fever, chills, weight loss, malaise/fatigue and diaphoresis.  HENT: Negative for ear discharge, ear pain, hoarse voice, postnasal drip, sinus pressure and sore throat.   Eyes: Negative for blurred vision.  Respiratory: Positive for cough. Negative for hemoptysis, sputum production, shortness of breath and wheezing.   Cardiovascular: Negative for chest pain, palpitations and leg swelling.  Gastrointestinal: Negative for heartburn, nausea, abdominal pain, diarrhea, constipation, blood in stool, melena, hematochezia and anorexia.  Genitourinary: Negative for dysuria, urgency, frequency and hematuria.  Musculoskeletal: Negative for myalgias, back pain, joint pain and neck pain.  Skin: Negative for pallor and rash.  Neurological: Negative for dizziness, tingling, sensory change, focal weakness, light-headedness, headaches and paresthesias.  Endo/Heme/Allergies: Negative for environmental allergies and polydipsia. Does not bruise/bleed easily.  Psychiatric/Behavioral: Negative for depression, suicidal ideas and confusion. The patient is not nervous/anxious and does not have insomnia.      Objective  Filed Vitals:   09/16/15 1414  BP: 120/62  Pulse: 80  Temp: 98 F (36.7 C)  TempSrc: Oral  Height:  (1.676 m)  Weight: 120 lb (54.432 kg)  SpO2: 95%    Physical Exam  Constitutional: She is well-developed, well-nourished, and in no distress. No distress.  HENT:  Head: Normocephalic and  atraumatic.  Right Ear: External ear normal.  Left Ear: External ear normal.  Nose: Nose normal.  Mouth/Throat: Oropharynx is clear and moist.  Eyes: Conjunctivae and EOM are normal. Pupils are equal, round, and  reactive to light. Right eye exhibits no discharge. Left eye exhibits no discharge.  Neck: Normal range of motion. Neck supple. No JVD present. No thyromegaly present.  Cardiovascular: Normal rate, regular rhythm, normal heart sounds and intact distal pulses.  Exam reveals no gallop and no friction rub.   No murmur heard. Pulmonary/Chest: Effort normal. No respiratory distress. She has no wheezes. She has rales.  Bibasilar rales  Abdominal: Soft. Bowel sounds are normal. She exhibits no mass. There is no tenderness. There is no guarding.  Musculoskeletal: Normal range of motion. She exhibits no edema.  Lymphadenopathy:    She has no cervical adenopathy.  Neurological: She is alert.  Skin: Skin is warm and dry. She is not diaphoretic.  Psychiatric: Mood and affect normal.  Nursing note and vitals reviewed.     Assessment & Plan  Problem List Items Addressed This Visit    None    Visit Diagnoses    Left lower lobe pneumonia    -  Primary    Relevant Medications    levofloxacin (LEVAQUIN) 500 MG tablet    Other Relevant Orders    DG Chest 2 View    CBC w/Diff/Platelet    Iron deficiency anemia        Relevant Medications    iron polysaccharides (FERREX 150) 150 MG capsule    Other Relevant Orders    CBC w/Diff/Platelet         Dr. Elizabeth Sauereanna Joelys Staubs [facility]  09/16/2015

## 2015-09-17 LAB — CBC WITH DIFFERENTIAL/PLATELET
BASOS: 0 %
Basophils Absolute: 0 10*3/uL (ref 0.0–0.2)
EOS (ABSOLUTE): 0.2 10*3/uL (ref 0.0–0.4)
Eos: 2 %
HEMATOCRIT: 42 % (ref 34.0–46.6)
Hemoglobin: 14.5 g/dL (ref 11.1–15.9)
IMMATURE GRANULOCYTES: 0 %
Immature Grans (Abs): 0 10*3/uL (ref 0.0–0.1)
LYMPHS ABS: 1.8 10*3/uL (ref 0.7–3.1)
Lymphs: 19 %
MCH: 30 pg (ref 26.6–33.0)
MCHC: 34.5 g/dL (ref 31.5–35.7)
MCV: 87 fL (ref 79–97)
MONOS ABS: 1.1 10*3/uL — AB (ref 0.1–0.9)
Monocytes: 11 %
NEUTROS PCT: 68 %
Neutrophils Absolute: 6.3 10*3/uL (ref 1.4–7.0)
Platelets: 279 10*3/uL (ref 150–379)
RBC: 4.84 x10E6/uL (ref 3.77–5.28)
RDW: 13.2 % (ref 12.3–15.4)
WBC: 9.4 10*3/uL (ref 3.4–10.8)

## 2015-09-19 ENCOUNTER — Ambulatory Visit (INDEPENDENT_AMBULATORY_CARE_PROVIDER_SITE_OTHER): Payer: Medicare Other | Admitting: Family Medicine

## 2015-09-19 ENCOUNTER — Encounter: Payer: Self-pay | Admitting: Family Medicine

## 2015-09-19 VITALS — BP 100/62 | HR 80 | Temp 98.1°F | Ht 66.0 in | Wt 120.0 lb

## 2015-09-19 DIAGNOSIS — J181 Lobar pneumonia, unspecified organism: Principal | ICD-10-CM

## 2015-09-19 DIAGNOSIS — J189 Pneumonia, unspecified organism: Secondary | ICD-10-CM

## 2015-09-19 DIAGNOSIS — F0390 Unspecified dementia without behavioral disturbance: Secondary | ICD-10-CM | POA: Diagnosis not present

## 2015-09-19 MED ORDER — DONEPEZIL HCL 10 MG PO TABS: ORAL_TABLET | ORAL | Status: DC

## 2015-09-19 NOTE — Progress Notes (Signed)
Name: Victoria AlbinoMargaret R Woods   MRN: [id number]    DOB: Apr 25, 1921   Date:09/19/2015       Progress Note  Subjective  Chief Complaint  Chief Complaint  Patient presents with  . URI    day 3 of Levaquin- "feels better" coughing spells still occur but not as bad as last visit    URI  The current episode started more than 1 year ago. The problem has been waxing and waning. Associated symptoms include coughing. Pertinent negatives include no abdominal pain, chest pain, congestion, diarrhea, dysuria, ear pain, headaches, joint pain, joint swelling, nausea, neck pain, plugged ear sensation, rash, rhinorrhea, sinus pain, sneezing, sore throat, swollen glands, vomiting or wheezing. The treatment provided mild relief.    No problem-specific assessment & plan notes found for this encounter.   Past Medical History  Diagnosis Date  . Anemia   . Dementia     Past Surgical History  Procedure Laterality Date  . Appendectomy    . Vaginal hysterectomy    . Tonsillectomy    . Partial hip arthroplasty Left     History reviewed. No pertinent family history.  Social History   Social History  . Marital Status: Widowed    Spouse Name: N/A  . Number of Children: N/A  . Years of Education: N/A   Occupational History  . Not on file.   Social History Main Topics  . Smoking status: Former Games developermoker  . Smokeless tobacco: Not on file  . Alcohol Use: No  . Drug Use: No  . Sexual Activity: Not on file   Other Topics Concern  . Not on file   Social History Narrative  . No narrative on file    No Known Allergies   Review of Systems  Constitutional: Negative for fever, chills, weight loss and malaise/fatigue.  HENT: Negative for congestion, ear discharge, ear pain, rhinorrhea, sneezing and sore throat.   Eyes: Negative for blurred vision.  Respiratory: Positive for cough. Negative for sputum production, shortness of breath and wheezing.   Cardiovascular: Negative for chest pain, palpitations  and leg swelling.  Gastrointestinal: Negative for heartburn, nausea, vomiting, abdominal pain, diarrhea, constipation, blood in stool and melena.  Genitourinary: Negative for dysuria, urgency, frequency and hematuria.  Musculoskeletal: Negative for myalgias, back pain, joint pain and neck pain.  Skin: Negative for rash.  Neurological: Negative for dizziness, tingling, sensory change, focal weakness and headaches.  Endo/Heme/Allergies: Negative for environmental allergies and polydipsia. Does not bruise/bleed easily.  Psychiatric/Behavioral: Negative for depression and suicidal ideas. The patient is not nervous/anxious and does not have insomnia.      Objective  Filed Vitals:   09/19/15 1430  BP: 100/62  Pulse: 80  Temp: 98.1 F (36.7 C)  TempSrc: Oral  Height: 5\' 6"  (1.676 m)  Weight: 120 lb (54.432 kg)  SpO2: 97%    Physical Exam  Constitutional: She is well-developed, well-nourished, and in no distress. No distress.  HENT:  Head: Normocephalic and atraumatic.  Right Ear: External ear normal.  Left Ear: External ear normal.  Nose: Nose normal.  Mouth/Throat: Oropharynx is clear and moist.  Eyes: Conjunctivae and EOM are normal. Pupils are equal, round, and reactive to light. Right eye exhibits no discharge. Left eye exhibits no discharge.  Neck: Normal range of motion. Neck supple. No JVD present. No thyromegaly present.  Cardiovascular: Normal rate, regular rhythm, normal heart sounds and intact distal pulses.  Exam reveals no gallop and no friction rub.   No murmur heard.  Pulmonary/Chest: Effort normal and breath sounds normal.  Abdominal: Soft. Bowel sounds are normal. She exhibits no mass. There is no tenderness. There is no guarding.  Musculoskeletal: Normal range of motion. She exhibits no edema.  Lymphadenopathy:    She has no cervical adenopathy.  Neurological: She is alert. She has normal reflexes.  Skin: Skin is warm and dry. She is not diaphoretic.   Psychiatric: Mood and affect normal.  Nursing note and vitals reviewed.     Assessment & Plan  Problem List Items Addressed This Visit    None    Visit Diagnoses    Left lower lobe pneumonia    -  Primary    Dementia, without behavioral disturbance        Relevant Medications    donepezil (ARICEPT) 10 MG tablet         Dr. Hayden [facility]  09/19/2015

## 2015-09-25 ENCOUNTER — Ambulatory Visit: Payer: Self-pay | Admitting: Family Medicine

## 2015-12-23 ENCOUNTER — Other Ambulatory Visit: Payer: Self-pay

## 2016-02-15 ENCOUNTER — Emergency Department
Admission: EM | Admit: 2016-02-15 | Discharge: 2016-02-15 | Disposition: A | Discharge status: Home / Self Care | Payer: Medicare Other | Attending: Emergency Medicine | Admitting: Emergency Medicine

## 2016-02-15 ENCOUNTER — Encounter: Payer: Self-pay | Admitting: Emergency Medicine

## 2016-02-15 ENCOUNTER — Emergency Department: Payer: Medicare Other

## 2016-02-15 DIAGNOSIS — Y9339 Activity, other involving climbing, rappelling and jumping off: Secondary | ICD-10-CM | POA: Insufficient documentation

## 2016-02-15 DIAGNOSIS — S0083XA Contusion of other part of head, initial encounter: Secondary | ICD-10-CM | POA: Insufficient documentation

## 2016-02-15 DIAGNOSIS — R410 Disorientation, unspecified: Secondary | ICD-10-CM | POA: Diagnosis not present

## 2016-02-15 DIAGNOSIS — N390 Urinary tract infection, site not specified: Secondary | ICD-10-CM | POA: Diagnosis not present

## 2016-02-15 DIAGNOSIS — Z87891 Personal history of nicotine dependence: Secondary | ICD-10-CM | POA: Diagnosis not present

## 2016-02-15 DIAGNOSIS — Y929 Unspecified place or not applicable: Secondary | ICD-10-CM | POA: Insufficient documentation

## 2016-02-15 DIAGNOSIS — Y999 Unspecified external cause status: Secondary | ICD-10-CM | POA: Insufficient documentation

## 2016-02-15 DIAGNOSIS — W108XXA Fall (on) (from) other stairs and steps, initial encounter: Secondary | ICD-10-CM | POA: Insufficient documentation

## 2016-02-15 DIAGNOSIS — Z79899 Other long term (current) drug therapy: Secondary | ICD-10-CM | POA: Insufficient documentation

## 2016-02-15 DIAGNOSIS — S0993XA Unspecified injury of face, initial encounter: Secondary | ICD-10-CM | POA: Diagnosis present

## 2016-02-15 LAB — CBC
HEMATOCRIT: 42.7 % (ref 35.0–47.0)
Hemoglobin: 14.3 g/dL (ref 12.0–16.0)
MCH: 29.7 pg (ref 26.0–34.0)
MCHC: 33.5 g/dL (ref 32.0–36.0)
MCV: 88.6 fL (ref 80.0–100.0)
PLATELETS: 146 10*3/uL — AB (ref 150–440)
RBC: 4.81 MIL/uL (ref 3.80–5.20)
RDW: 13.6 % (ref 11.5–14.5)
WBC: 5.9 10*3/uL (ref 3.6–11.0)

## 2016-02-15 LAB — BASIC METABOLIC PANEL
ANION GAP: 6 (ref 5–15)
BUN: 16 mg/dL (ref 6–20)
CALCIUM: 8.7 mg/dL — AB (ref 8.9–10.3)
CO2: 29 mmol/L (ref 22–32)
CREATININE: 0.86 mg/dL (ref 0.44–1.00)
Chloride: 104 mmol/L (ref 101–111)
GFR calc Af Amer: >60 mL/min (ref >60)
GFR, EST NON AFRICAN AMERICAN: 57 mL/min — AB (ref >60)
GLUCOSE: 121 mg/dL — AB (ref 65–99)
Potassium: 3.4 mmol/L — ABNORMAL LOW (ref 3.5–5.1)
Sodium: 139 mmol/L (ref 135–145)

## 2016-02-15 LAB — URINALYSIS COMPLETE WITH MICROSCOPIC (ARMC ONLY)
BILIRUBIN URINE: NEGATIVE
GLUCOSE, UA: NEGATIVE mg/dL
Leukocytes, UA: NEGATIVE
Nitrite: POSITIVE — AB
Protein, ur: 100 mg/dL — AB
RBC / HPF: NONE SEEN RBC/hpf (ref 0–5)
Specific Gravity, Urine: 1.027 (ref 1.005–1.030)
pH: 5 (ref 5.0–8.0)

## 2016-02-15 MED ORDER — FOSFOMYCIN TROMETHAMINE 3 G PO PACK: 3.0000 g | PACK | Freq: Once | ORAL | Status: DC

## 2016-02-15 MED ORDER — FOSFOMYCIN TROMETHAMINE 3 G PO PACK
3.0000 g | PACK | ORAL | Status: AC
  Administered 2016-02-15: 3 g via ORAL
  Filled 2016-02-15: qty 3

## 2016-02-15 MED ORDER — SODIUM CHLORIDE 0.9 % IV BOLUS (SEPSIS)
1000.0000 mL | Freq: Once | INTRAVENOUS | Status: AC
  Administered 2016-02-15: 1000 mL via INTRAVENOUS

## 2016-02-15 NOTE — ED Notes (Signed)
Family reports pt is normally able to ambulate on her own or with some assistance but now loses her balance after a few steps. Family also reports pt usually has very poor short term memory but today has been having trouble following commands and performing ADLs like she normally does.

## 2016-02-15 NOTE — ED Notes (Signed)
Discussed discharge instructions, prescriptions, and follow-up care with patient's care givers. No questions or concerns at this time. Pt stable at discharge. 

## 2016-02-15 NOTE — ED Notes (Signed)
Pt presents to ED via EMS c/o multiple falls over past week, increased weakness and confusion per family, and decreased urine output. Pt has bruising to both eyes, bridge of nose, and R side of chin EMS states is from a fall on Tuesday. Pt has hx alzheimer's dementia, oriented to person only. Pt denies pain at this time. No blood thinners noted in pt's bag of medications.

## 2016-02-15 NOTE — ED Provider Notes (Signed)
[facility] Emergency Department Provider Note  ____________________________________________  Time seen: Approximately 12:25 PM  I have reviewed the triage vital signs and the nursing notes.   HISTORY  Chief Complaint Fall    HPI Victoria Woods is a 82 y.o. female history of dementia and anemia. She currently lives at home with 24 7 caretaker and daughter.  Family reports that the patient has fallen before and broken a hip, and that she's had issues with frequent falls. On Tuesday she took a fall after walking out of the bathroom, got up and did not have any issue that over the next few days familysignificant bruising across both sides of the face. The family is not very concerned regarding the fact that she has fallen, but they came today rather because she seemed more weak today than normal. The patient has a history of some weakness, but today she felt very tired after only walking a few steps. She's been just a little more confused than her normal baseline. She continues to eat well at breakfast well.  They have noticed that she is been urinating more frequently in the last 1-2 days, and question if she might have urinary tract infection. She got a fevers chills nausea vomiting diarrhea. No trouble breathing or wheezing. No facial droop, no weakness is one arm or one leg.   Past Medical History  Diagnosis Date  . Anemia   . Dementia     There are no active problems to display for this patient.   Past Surgical History  Procedure Laterality Date  . Appendectomy    . Vaginal hysterectomy    . Tonsillectomy    . Partial hip arthroplasty Left     Current Outpatient Rx  Name  Route  Sig  Dispense  Refill  . carbamazepine (TEGRETOL) 100 MG chewable tablet   Oral   Chew 100 mg by mouth 2 (two) times daily.         . Cholecalciferol (VITAMIN D3) 1000 units CAPS   Oral   Take 1,000 Units by mouth daily.         Marland Kitchen. donepezil (ARICEPT) 10 MG  tablet      TAKE (1) TABLET BY MOUTH EVERY DAY   30 tablet   5   . iron polysaccharides (FERREX 150) 150 MG capsule   Oral   Take 150 mg by mouth daily.          . Multiple Vitamin (MULTI VITAMIN) TABS   Oral   Take 1 tablet by mouth daily.         . fosfomycin (MONUROL) 3 g PACK   Oral   Take 3 g by mouth once. Please mix in 8 oz of water, take by mouth once   3 g   0   . levofloxacin (LEVAQUIN) 500 MG tablet   Oral   Take 1 tablet (500 mg total) by mouth daily.   7 tablet   0     Allergies Review of patient's allergies indicates no known allergies.  History reviewed. No pertinent family history.  Social History Social History  Substance Use Topics  . Smoking status: Former Games developermoker  . Smokeless tobacco: None  . Alcohol Use: No    Review of Systems Constitutional: No fever/chills Eyes: No visual changes. ENT: No sore throat. Cardiovascular: Denies chest pain. Respiratory: Denies shortness of breath. Gastrointestinal: No abdominal pain.  No nausea, no vomiting.  No diarrhea.  No constipation. Genitourinary: Negative for dysuria.  See history of present illness Musculoskeletal: Negative for back pain. Skin: Negative for rash. Bruising noted over the face after falling. Patient denies any neck pain. Patient denies being in any pain or discomfort. Neurological: Negative for headaches, focal weakness or numbness.  10-point ROS otherwise negative.  ____________________________________________   PHYSICAL EXAM:  VITAL SIGNS: ED Triage Vitals  Enc Vitals Group     BP 02/15/16 1149 92/76 mmHg     Pulse Rate 02/15/16 1149 94     Resp 02/15/16 1149 20     Temp 02/15/16 1149 98.5 F (36.9 C)     Temp Source 02/15/16 1149 Oral     SpO2 02/15/16 1149 97 %     Weight 02/15/16 1150 112 lb 12.8 oz (51.166 kg)     Height 02/15/16 1150 5' 7.5" (1.715 m)     Head Cir --      Peak Flow --      Pain Score --      Pain Loc --      Pain Edu? --      Excl. in GC?  --    Constitutional: Alert and orientedTo daughter and caretaker but not to date, she does know she is at the hospital. Well appearing and in no acute distress. Eyes: Conjunctivae are normal. PERRL. EOMI. Head: Atraumatic except for bilateral ecchymosis or around both orbits and across the bridge of the nose without clear deformity or step-off. No proptosis. No hemotympanum. No dental fracture. Nose: No congestion/rhinnorhea. Mouth/Throat: Mucous membranes are moist.  Oropharynx non-erythematous. Neck: No stridor.  No cervical spine tenderness Cardiovascular: Normal rate, regular rhythm. Grossly normal heart sounds.  Good peripheral circulation. Respiratory: Normal respiratory effort.  No retractions. Lungs CTAB. Gastrointestinal: Soft and nontender. No distention. No abdominal bruits. No CVA tenderness. Musculoskeletal: No lower extremity tenderness nor edema.  No joint effusions. Neurologic:  Normal speech and language. No gross focal neurologic deficits are appreciated. Patient demonstrates 5 out of 5 strength in all extremities. No facial droop. No ataxia. No pronator drift. Skin:  Skin is warm, dry and intact. No rash noted. Psychiatric: Mood and affect are normal. Speech and behavior are normal.  ____________________________________________   LABS (all labs ordered are listed, but only abnormal results are displayed)  Labs Reviewed  CBC - Abnormal; Notable for the following:    Platelets 146 (*)    All other components within normal limits  BASIC METABOLIC PANEL - Abnormal; Notable for the following:    Potassium 3.4 (*)    Glucose, Bld 121 (*)    Calcium 8.7 (*)    GFR calc non Af Amer 57 (*)    All other components within normal limits  URINALYSIS COMPLETEWITH MICROSCOPIC ([facility] ONLY) - Abnormal; Notable for the following:    Color, Urine AMBER (*)    APPearance HAZY (*)    Ketones, ur TRACE (*)    Hgb urine dipstick 1+ (*)    Protein, ur 100 (*)    Nitrite POSITIVE (*)     Bacteria, UA MANY (*)    Squamous Epithelial / LPF 0-5 (*)    All other components within normal limits  URINE CULTURE   ____________________________________________  EKG  Reviewed and interpreted by me at 1 PM Normal sinus Heart rate 82 QRS 100 QTc 450 Reviewed and interpreted as normal sinus rhythm, nonspecific T-wave abnormality noted 3 and V3, no evidence of clear obvious ischemic abnormality.  ____________________________________________  RADIOLOGY  IMPRESSION: 1. No acute bleed. White matter  changes, progressed since 2013. An MRI could better assess for acute changes if clinically warranted. 2. No facial bone fractures. 3. There is a the bony fragment anterior to the inferior endplate of C5 with an associated focus of air to the left. Whether this represents a subtle fracture or degenerative change is unclear on this study. However, the focus of air and the lack of focal cervical spine tenderness per the emergency room physician would suggests this degenerative. 4. Severe degenerative changes in the cervical spine with canal narrowing to 10 mm at C6-7. Findings called to Dr. Fanny Bien.  The patient has absolutely no cervical thoracic or lumbar tenderness. She has full range of motion of the head and neck without any pain or restriction. I find clinically this is extremely unlikely to represent a clinically significant fracture, and likely represents degenerative change. Intact neurologic exam. ____________________________________________   PROCEDURES  Procedure(s) performed: None  Critical Care performed: No  ____________________________________________   INITIAL IMPRESSION / ASSESSMENT AND PLAN / ED COURSE  Pertinent labs & imaging results that were available during my care of the patient were reviewed by me and considered in my medical decision making (see chart for details).  Patient presents for a fall. She's also been having some slight confusion and  increased urination the last day. She has a reassuring exam, stable hemodynamics and is in no distress. CT performed to evaluate for traumatic injury due to significant bruising of the face. CT demonstrates no clear obvious acute abnormality or injury. Lab work relatively reassuring, the urinalysis does demonstrate nitrites as well as many bacteria, is a clean sample and given her history of dysuria we will treat for possible urinary tract infection. We will hydrate the patient well, and family and caretaker very comfortable taking her home and monitoring her symptoms. We will re-dose fosfomycin in 3 days, and follow up closely with her primary care doctor.  Oh evidence of acute cardiac abnormality. No cardiac or pulmonary symptoms. EKG does not demonstrate new abnormality as compared with her previous EKG from August 2015.   ____________________________________________   FINAL CLINICAL IMPRESSION(S) / ED DIAGNOSES  Final diagnoses:  Facial contusion, initial encounter  Urinary tract infection, acute      Sharyn Creamer, MD 02/15/16 1425

## 2016-02-15 NOTE — Discharge Instructions (Signed)
You have been seen in the Emergency Department (ED) today for some increased weakness.  Your workup today suggests that you have a urinary tract infection (UTI).  Please take your antibiotic as prescribed.  Drink PLENTY of fluids.  Call your regular doctor to schedule the next available appointment to follow up on todays ED visit, or return immediately to the ED if your pain worsens, you have decreased urine production, develop fever, persistent vomiting, or other symptoms that concern you.    You have been seen in the Emergency Department (ED) today for a fall.  Your work up does not show any concerning injuries.   Please follow up with your doctor regarding today's Emergency Department (ED) visit and your recent fall.        Return to the ED if you have any headache, confusion, slurred speech, weakness/numbness of any arm or leg, or any increased pain.   Urinary Tract Infection Urinary tract infections (UTIs) can develop anywhere along your urinary tract. Your urinary tract is your body's drainage system for removing wastes and extra water. Your urinary tract includes two kidneys, two ureters, a bladder, and a urethra. Your kidneys are a pair of bean-shaped organs. Each kidney is about the size of your fist. They are located below your ribs, one on each side of your spine. CAUSES Infections are caused by microbes, which are microscopic organisms, including fungi, viruses, and bacteria. These organisms are so small that they can only be seen through a microscope. Bacteria are the microbes that most commonly cause UTIs. SYMPTOMS  Symptoms of UTIs may vary by age and gender of the patient and by the location of the infection. Symptoms in young women typically include a frequent and intense urge to urinate and a painful, burning feeling in the bladder or urethra during urination. Older women and men are more likely to be tired, shaky, and weak and have muscle aches and abdominal pain. A fever  may mean the infection is in your kidneys. Other symptoms of a kidney infection include pain in your back or sides below the ribs, nausea, and vomiting. DIAGNOSIS To diagnose a UTI, your caregiver will ask you about your symptoms. Your caregiver will also ask you to provide a urine sample. The urine sample will be tested for bacteria and white blood cells. White blood cells are made by your body to help fight infection. TREATMENT  Typically, UTIs can be treated with medication. Because most UTIs are caused by a bacterial infection, they usually can be treated with the use of antibiotics. The choice of antibiotic and length of treatment depend on your symptoms and the type of bacteria causing your infection. HOME CARE INSTRUCTIONS  If you were prescribed antibiotics, take them exactly as your caregiver instructs you. Finish the medication even if you feel better after you have only taken some of the medication.  Drink enough water and fluids to keep your urine clear or pale yellow.  Avoid caffeine, tea, and carbonated beverages. They tend to irritate your bladder.  Empty your bladder often. Avoid holding urine for long periods of time.  Empty your bladder before and after sexual intercourse.  After a bowel movement, women should cleanse from front to back. Use each tissue only once. SEEK MEDICAL CARE IF:   You have back pain.  You develop a fever.  Your symptoms do not begin to resolve within 3 days. SEEK IMMEDIATE MEDICAL CARE IF:   You have severe back pain or lower abdominal pain.  You develop chills.  You have nausea or vomiting.  You have continued burning or discomfort with urination. MAKE SURE YOU:   Understand these instructions.  Will watch your condition.  Will get help right away if you are not doing well or get worse.   This information is not intended to replace advice given to you by your health care provider. Make sure you discuss any questions you have with  your health care provider.   Document Released: 06/24/2005 Document Revised: 06/05/2015 Document Reviewed: 10/23/2011 Elsevier Interactive Patient Education [employer]

## 2016-02-18 ENCOUNTER — Ambulatory Visit
Admission: RE | Admit: 2016-02-18 | Discharge: 2016-02-18 | Disposition: A | Discharge status: Home / Self Care | Payer: Medicare Other | Source: Ambulatory Visit | Attending: Family Medicine | Admitting: Family Medicine

## 2016-02-18 ENCOUNTER — Encounter: Payer: Self-pay | Admitting: Family Medicine

## 2016-02-18 ENCOUNTER — Ambulatory Visit (INDEPENDENT_AMBULATORY_CARE_PROVIDER_SITE_OTHER): Payer: Medicare Other | Admitting: Family Medicine

## 2016-02-18 VITALS — BP 90/58 | HR 96 | Temp 97.9°F | Ht 67.0 in | Wt 112.0 lb

## 2016-02-18 DIAGNOSIS — R400 Somnolence: Secondary | ICD-10-CM

## 2016-02-18 DIAGNOSIS — R410 Disorientation, unspecified: Secondary | ICD-10-CM | POA: Diagnosis not present

## 2016-02-18 DIAGNOSIS — I517 Cardiomegaly: Secondary | ICD-10-CM | POA: Insufficient documentation

## 2016-02-18 DIAGNOSIS — M79641 Pain in right hand: Secondary | ICD-10-CM

## 2016-02-18 DIAGNOSIS — N3000 Acute cystitis without hematuria: Secondary | ICD-10-CM

## 2016-02-18 DIAGNOSIS — S62614A Displaced fracture of proximal phalanx of right ring finger, initial encounter for closed fracture: Secondary | ICD-10-CM

## 2016-02-18 DIAGNOSIS — S2231XA Fracture of one rib, right side, initial encounter for closed fracture: Secondary | ICD-10-CM | POA: Insufficient documentation

## 2016-02-18 DIAGNOSIS — W19XXXA Unspecified fall, initial encounter: Secondary | ICD-10-CM

## 2016-02-18 DIAGNOSIS — R69 Illness, unspecified: Secondary | ICD-10-CM | POA: Diagnosis not present

## 2016-02-18 DIAGNOSIS — M19041 Primary osteoarthritis, right hand: Secondary | ICD-10-CM

## 2016-02-18 DIAGNOSIS — N390 Urinary tract infection, site not specified: Secondary | ICD-10-CM | POA: Diagnosis not present

## 2016-02-18 LAB — URINE CULTURE
Culture: >=100000 — AB
SPECIAL REQUESTS: NORMAL

## 2016-02-18 MED ORDER — NITROFURANTOIN MONOHYD MACRO 100 MG PO CAPS
100.0000 mg | ORAL_CAPSULE | Freq: Two times a day (BID) | ORAL | Status: DC
Start: 2016-02-18 — End: 2016-02-19

## 2016-02-18 NOTE — Progress Notes (Signed)
Name: Victoria AlbinoMargaret R Woods   MRN: [id number]    DOB: Aug 02, 1921   Date:02/18/2016       Progress Note  Subjective  Chief Complaint  Chief Complaint  Patient presents with  . Follow-up    hospital ER visit on Sat 02/15/2016- fall- had CT scan of head, face and neck- all clear    Urinary Tract Infection  This is a new problem. The current episode started in the past 7 days. The problem occurs intermittently. The problem has been gradually worsening. Pertinent negatives include no chills, discharge, flank pain, frequency, hematuria, hesitancy, nausea, possible pregnancy, sweats, urgency or vomiting. She has tried antibiotics for the symptoms. The treatment provided mild relief.  Hand Injury  The incident occurred 5 to 7 days ago. The incident occurred at home. The injury mechanism was a fall. The pain is present in the right hand. The quality of the pain is described as aching. Pertinent negatives include no chest pain or tingling. The symptoms are aggravated by palpation.  Dizziness This is a new (used for change in mental status) problem. The current episode started in the past 7 days. The problem occurs intermittently. The problem has been waxing and waning. Pertinent negatives include no abdominal pain, chest pain, chills, coughing, fever, headaches, myalgias, nausea, neck pain, rash, sore throat or vomiting. Nothing aggravates the symptoms.    No problem-specific assessment & plan notes found for this encounter.   Past Medical History  Diagnosis Date  . Anemia   . Dementia     Past Surgical History  Procedure Laterality Date  . Appendectomy    . Vaginal hysterectomy    . Tonsillectomy    . Partial hip arthroplasty Left     History reviewed. No pertinent family history.  Social History   Social History  . Marital Status: Widowed    Spouse Name: N/A  . Number of Children: N/A  . Years of Education: N/A   Occupational History  . Not on file.   Social History Main Topics   . Smoking status: Former Games developermoker  . Smokeless tobacco: Not on file  . Alcohol Use: No  . Drug Use: No  . Sexual Activity: Not on file   Other Topics Concern  . Not on file   Social History Narrative    No Known Allergies   Review of Systems  Constitutional: Negative for fever, chills, weight loss and malaise/fatigue.  HENT: Negative for ear discharge, ear pain and sore throat.   Eyes: Negative for blurred vision.  Respiratory: Negative for cough, sputum production, shortness of breath and wheezing.   Cardiovascular: Negative for chest pain, palpitations and leg swelling.  Gastrointestinal: Negative for heartburn, nausea, vomiting, abdominal pain, diarrhea, constipation, blood in stool and melena.  Genitourinary: Negative for dysuria, hesitancy, urgency, frequency, hematuria and flank pain.  Musculoskeletal: Positive for joint pain. Negative for myalgias, back pain and neck pain.  Skin: Negative for rash.  Neurological: Positive for dizziness. Negative for tingling, sensory change, focal weakness and headaches.       More somnolent  Endo/Heme/Allergies: Negative for environmental allergies and polydipsia. Does not bruise/bleed easily.  Psychiatric/Behavioral: Negative for depression and suicidal ideas. The patient is not nervous/anxious and does not have insomnia.      Objective  Filed Vitals:   02/18/16 1337  BP: 90/58  Pulse: 96  Temp: 97.9 F (36.6 C)  TempSrc: Oral  Height: 5\' 7"  (1.702 m)  Weight: 112 lb (50.803 kg)  SpO2: 96%  Physical Exam  Constitutional: She is well-developed, well-nourished, and in no distress. No distress.  HENT:  Head: Normocephalic and atraumatic.  Right Ear: External ear normal.  Left Ear: External ear normal.  Nose: Nose normal.  Mouth/Throat: Oropharynx is clear and moist.  Eyes: Conjunctivae and EOM are normal. Pupils are equal, round, and reactive to light. Right eye exhibits no discharge. Left eye exhibits no discharge.   Neck: Normal range of motion. Neck supple. No JVD present. No thyromegaly present.  Cardiovascular: Normal rate, regular rhythm, normal heart sounds and intact distal pulses.  Exam reveals no gallop and no friction rub.   No murmur heard. Pulmonary/Chest: Effort normal and breath sounds normal. She has no wheezes. She has no rales.  Abdominal: Soft. Bowel sounds are normal. She exhibits no mass. There is no tenderness. There is no guarding.  Musculoskeletal: Normal range of motion. She exhibits no edema.       Right hand: She exhibits tenderness, bony tenderness and swelling.  ecchymosis  Lymphadenopathy:    She has no cervical adenopathy.  Neurological: She is alert. She has normal reflexes.  Skin: Skin is warm and dry. Ecchymosis noted. She is not diaphoretic.  Psychiatric: Mood and affect normal.      Assessment & Plan  Problem List Items Addressed This Visit    None    Visit Diagnoses    Acute cystitis without hematuria    -  Primary    Relevant Medications    nitrofurantoin, macrocrystal-monohydrate, (MACROBID) 100 MG capsule    Somnolence        hold tegritol    Relevant Orders    CBC    Basic metabolic panel    DG Chest 2 View (Completed)    Hand pain, right        Relevant Orders    DG Hand Complete Right (Completed)    Taking medication for chronic disease        Relevant Orders    Carbamazepine Level (Tegretol), total         Dr. [facility]  02/18/2016

## 2016-02-19 ENCOUNTER — Inpatient Hospital Stay
Admission: EM | Admit: 2016-02-19 | Discharge: 2016-02-22 | DRG: 689 | Disposition: A | Discharge status: Home Health | Payer: Medicare Other | Attending: Internal Medicine | Admitting: Internal Medicine

## 2016-02-19 ENCOUNTER — Encounter: Payer: Self-pay | Admitting: Emergency Medicine

## 2016-02-19 ENCOUNTER — Emergency Department: Payer: Medicare Other

## 2016-02-19 DIAGNOSIS — R531 Weakness: Secondary | ICD-10-CM | POA: Diagnosis present

## 2016-02-19 DIAGNOSIS — S62604D Fracture of unspecified phalanx of right ring finger, subsequent encounter for fracture with routine healing: Secondary | ICD-10-CM | POA: Diagnosis not present

## 2016-02-19 DIAGNOSIS — N390 Urinary tract infection, site not specified: Secondary | ICD-10-CM | POA: Diagnosis present

## 2016-02-19 DIAGNOSIS — E876 Hypokalemia: Secondary | ICD-10-CM | POA: Diagnosis present

## 2016-02-19 DIAGNOSIS — Z1612 Extended spectrum beta lactamase (ESBL) resistance: Secondary | ICD-10-CM | POA: Diagnosis present

## 2016-02-19 DIAGNOSIS — W19XXXD Unspecified fall, subsequent encounter: Secondary | ICD-10-CM | POA: Diagnosis present

## 2016-02-19 DIAGNOSIS — R410 Disorientation, unspecified: Secondary | ICD-10-CM | POA: Diagnosis present

## 2016-02-19 DIAGNOSIS — G9341 Metabolic encephalopathy: Secondary | ICD-10-CM | POA: Diagnosis present

## 2016-02-19 DIAGNOSIS — Z79899 Other long term (current) drug therapy: Secondary | ICD-10-CM | POA: Diagnosis not present

## 2016-02-19 DIAGNOSIS — Z87891 Personal history of nicotine dependence: Secondary | ICD-10-CM | POA: Diagnosis not present

## 2016-02-19 DIAGNOSIS — Z452 Encounter for adjustment and management of vascular access device: Secondary | ICD-10-CM

## 2016-02-19 DIAGNOSIS — F0390 Unspecified dementia without behavioral disturbance: Secondary | ICD-10-CM | POA: Diagnosis present

## 2016-02-19 DIAGNOSIS — Z96642 Presence of left artificial hip joint: Secondary | ICD-10-CM | POA: Diagnosis present

## 2016-02-19 HISTORY — DX: Repeated falls: R29.6

## 2016-02-19 HISTORY — DX: Unspecified fall, initial encounter: W19.XXXA

## 2016-02-19 LAB — URINALYSIS COMPLETE WITH MICROSCOPIC (ARMC ONLY)
BILIRUBIN URINE: NEGATIVE
GLUCOSE, UA: NEGATIVE mg/dL
HGB URINE DIPSTICK: NEGATIVE
KETONES UR: NEGATIVE mg/dL
NITRITE: NEGATIVE
PH: 5 (ref 5.0–8.0)
Protein, ur: 30 mg/dL — AB
Specific Gravity, Urine: 1.024 (ref 1.005–1.030)

## 2016-02-19 LAB — BASIC METABOLIC PANEL
ANION GAP: 7 (ref 5–15)
BUN/Creatinine Ratio: 18 (ref 12–28)
BUN: 14 mg/dL (ref 10–36)
BUN: 14 mg/dL (ref 6–20)
CALCIUM: 9.2 mg/dL (ref 8.7–10.3)
CALCIUM: 8.3 mg/dL — AB (ref 8.9–10.3)
CHLORIDE: 103 mmol/L (ref 101–111)
CO2: 23 mmol/L (ref 18–29)
CO2: 28 mmol/L (ref 22–32)
CREATININE: 0.78 mg/dL (ref 0.57–1.00)
CREATININE: 0.65 mg/dL (ref 0.44–1.00)
Chloride: 97 mmol/L (ref 96–106)
GFR calc Af Amer: 76 mL/min/{1.73_m2} (ref >59)
GFR calc Af Amer: >60 mL/min (ref >60)
GFR calc non Af Amer: >60 mL/min (ref >60)
GFR, EST NON AFRICAN AMERICAN: 66 mL/min/{1.73_m2} (ref >59)
GLUCOSE: 104 mg/dL — AB (ref 65–99)
Glucose: 116 mg/dL — ABNORMAL HIGH (ref 65–99)
Potassium: 3.3 mmol/L — ABNORMAL LOW (ref 3.5–5.2)
Potassium: 3.3 mmol/L — ABNORMAL LOW (ref 3.5–5.1)
Sodium: 140 mmol/L (ref 134–144)
Sodium: 138 mmol/L (ref 135–145)

## 2016-02-19 LAB — CBC
HEMATOCRIT: 44.9 % (ref 34.0–46.6)
HEMOGLOBIN: 15.1 g/dL (ref 11.1–15.9)
MCH: 29.7 pg (ref 26.6–33.0)
MCHC: 33.6 g/dL (ref 31.5–35.7)
MCV: 88 fL (ref 79–97)
Platelets: 139 10*3/uL — ABNORMAL LOW (ref 150–379)
RBC: 5.08 x10E6/uL (ref 3.77–5.28)
RDW: 13.7 % (ref 12.3–15.4)
WBC: 7.2 10*3/uL (ref 3.4–10.8)

## 2016-02-19 LAB — LACTIC ACID, PLASMA: Lactic Acid, Venous: 1 mmol/L (ref 0.5–2.0)

## 2016-02-19 LAB — CBC WITH DIFFERENTIAL/PLATELET
BASOS ABS: 0 10*3/uL (ref 0–0.1)
Basophils Relative: 1 %
Eosinophils Absolute: 0.4 10*3/uL (ref 0–0.7)
Eosinophils Relative: 5 %
HCT: 38.7 % (ref 35.0–47.0)
HEMOGLOBIN: 13.1 g/dL (ref 12.0–16.0)
LYMPHS ABS: 0.8 10*3/uL — AB (ref 1.0–3.6)
MCH: 29.4 pg (ref 26.0–34.0)
MCHC: 33.8 g/dL (ref 32.0–36.0)
MCV: 87.1 fL (ref 80.0–100.0)
Monocytes Absolute: 0.9 10*3/uL (ref 0.2–0.9)
Neutro Abs: 5.4 10*3/uL (ref 1.4–6.5)
Platelets: 142 10*3/uL — ABNORMAL LOW (ref 150–440)
RBC: 4.45 MIL/uL (ref 3.80–5.20)
RDW: 13.7 % (ref 11.5–14.5)
WBC: 7.5 10*3/uL (ref 3.6–11.0)

## 2016-02-19 LAB — TROPONIN I

## 2016-02-19 LAB — CARBAMAZEPINE LEVEL, TOTAL: CARBAMAZEPINE LVL: 4.9 ug/mL (ref 4.0–12.0)

## 2016-02-19 MED ORDER — ACETAMINOPHEN 325 MG PO TABS: 650.0000 mg | ORAL_TABLET | Freq: Four times a day (QID) | ORAL | Status: DC | PRN

## 2016-02-19 MED ORDER — ONDANSETRON HCL 4 MG/2ML IJ SOLN: 4.0000 mg | Freq: Four times a day (QID) | INTRAMUSCULAR | Status: DC | PRN

## 2016-02-19 MED ORDER — MEROPENEM 1 G IV SOLR
1.0000 g | Freq: Two times a day (BID) | INTRAVENOUS | Status: DC
  Administered 2016-02-19 – 2016-02-22 (×6): 1 g via INTRAVENOUS
  Filled 2016-02-19 (×7): qty 1

## 2016-02-19 MED ORDER — SODIUM CHLORIDE 0.9 % IV SOLN: 1.0000 g | Freq: Three times a day (TID) | INTRAVENOUS | Status: DC

## 2016-02-19 MED ORDER — ACETAMINOPHEN 650 MG RE SUPP
650.0000 mg | Freq: Four times a day (QID) | RECTAL | Status: DC | PRN
Start: 2016-02-19 — End: 2016-02-22

## 2016-02-19 MED ORDER — SODIUM CHLORIDE 0.9 % IV SOLN
500.0000 mg | Freq: Once | INTRAVENOUS | Status: AC
  Administered 2016-02-19: 500 mg via INTRAVENOUS
  Filled 2016-02-19: qty 500

## 2016-02-19 MED ORDER — DONEPEZIL HCL 5 MG PO TABS
10.0000 mg | ORAL_TABLET | Freq: Every day | ORAL | Status: DC
  Administered 2016-02-19 – 2016-02-21 (×3): 10 mg via ORAL
  Filled 2016-02-19 (×3): qty 2

## 2016-02-19 MED ORDER — SODIUM CHLORIDE 0.9 % IV BOLUS (SEPSIS)
500.0000 mL | Freq: Once | INTRAVENOUS | Status: AC
  Administered 2016-02-19: 500 mL via INTRAVENOUS

## 2016-02-19 MED ORDER — CARBAMAZEPINE 100 MG PO CHEW: 100.0000 mg | CHEWABLE_TABLET | Freq: Two times a day (BID) | ORAL | Status: DC

## 2016-02-19 MED ORDER — ENOXAPARIN SODIUM 40 MG/0.4ML ~~LOC~~ SOLN
40.0000 mg | SUBCUTANEOUS | Status: DC
  Administered 2016-02-19 – 2016-02-21 (×3): 40 mg via SUBCUTANEOUS
  Filled 2016-02-19 (×3): qty 0.4

## 2016-02-19 MED ORDER — PIPERACILLIN-TAZOBACTAM 3.375 G IVPB 30 MIN
3.3750 g | Freq: Once | INTRAVENOUS | Status: DC
  Filled 2016-02-19: qty 50

## 2016-02-19 MED ORDER — POTASSIUM CHLORIDE IN NACL 20-0.9 MEQ/L-% IV SOLN
INTRAVENOUS | Status: DC
  Administered 2016-02-19: 75 mL/h via INTRAVENOUS
  Administered 2016-02-20: 09:00:00 via INTRAVENOUS
  Filled 2016-02-19 (×2): qty 1000

## 2016-02-19 MED ORDER — ONDANSETRON HCL 4 MG PO TABS: 4.0000 mg | ORAL_TABLET | Freq: Four times a day (QID) | ORAL | Status: DC | PRN

## 2016-02-19 MED ORDER — ADULT MULTIVITAMIN W/MINERALS CH
1.0000 | ORAL_TABLET | Freq: Every day | ORAL | Status: DC
  Administered 2016-02-20 – 2016-02-22 (×3): 1 via ORAL
  Filled 2016-02-19 (×3): qty 1

## 2016-02-19 MED ORDER — POLYSACCHARIDE IRON COMPLEX 150 MG PO CAPS
150.0000 mg | ORAL_CAPSULE | Freq: Every day | ORAL | Status: DC
  Administered 2016-02-20 – 2016-02-22 (×3): 150 mg via ORAL
  Filled 2016-02-19 (×3): qty 1

## 2016-02-19 MED ORDER — VITAMIN D 1000 UNITS PO TABS
1000.0000 [IU] | ORAL_TABLET | Freq: Every day | ORAL | Status: DC
  Administered 2016-02-20 – 2016-02-22 (×3): 1000 [IU] via ORAL
  Filled 2016-02-19 (×3): qty 1

## 2016-02-19 NOTE — Progress Notes (Signed)
Spoke with Dr. Nemiah CommanderKalisetti because family states that primary care physician stopped the Tegretol due to her not being sure if this was causing her falls.

## 2016-02-19 NOTE — ED Provider Notes (Addendum)
[facility] Emergency Department Provider Note   ____________________________________________  Time seen: Approximately 11:45 AM I have reviewed the triage vital signs and the triage nursing note.  HISTORY  Chief Complaint Fall   Historian Patient's daughter and 24/7 caregiver Patient has dementia and cannot provide any history for sulfa  HPI Victoria Woods is a 82 y.o. female who lives at home with 24 7 caregivers, with a daughter living very close and checking in on her frequently, with a history of dementia, frequent falls, and recently diagnosed urinary tract infection 4 days ago. She was given fosfomycin that day and told to repeat the dose in 3 days, but they were unable to obtain fosfomycin from the pharmacy, and they did have a follow-up appointment with the primary care physician, Dr. Elizabeth Sauer, yesterday, who reviewed the culture showing many resistances, but sensitive to Macrobid as the onlyby mouth antibiotic tested. She was changed to this medication.  Daughter states that the patient seemed to be a little better on Monday, then on Tuesday had worsening weakness, which is even worse this morning where she was unable to stand up on her own. No fevers. No new trauma.  No reported pain. Symptoms are moderate.  Daughter is here local, the son is also involved, but reportedly his lives in [place].  He is a retired Development worker, community, per the daughter. They are aware of the possibility of acute care rehabilitation, but they have tried their best with 24-hour caregivers to keep the patient at home as much as possible.  Daughter states that in addition to the weakness which was more pronounced over the last day, the patient was also confused within the conversation, which is kind of unusual for her.    Past Medical History  Diagnosis Date  . Anemia   . Dementia     There are no active problems to display for this patient.   Past Surgical History   Procedure Laterality Date  . Appendectomy    . Vaginal hysterectomy    . Tonsillectomy    . Partial hip arthroplasty Left     Current Outpatient Rx  Name  Route  Sig  Dispense  Refill  . carbamazepine (TEGRETOL) 100 MG chewable tablet   Oral   Chew 100 mg by mouth 2 (two) times daily.         . Cholecalciferol (VITAMIN D3) 1000 units CAPS   Oral   Take 1,000 Units by mouth daily.         Marland Kitchen donepezil (ARICEPT) 10 MG tablet   Oral   Take 10 mg by mouth daily.         . iron polysaccharides (FERREX 150) 150 MG capsule   Oral   Take 150 mg by mouth daily.          . Multiple Vitamin (MULTI VITAMIN) TABS   Oral   Take 1 tablet by mouth daily.         . nitrofurantoin, macrocrystal-monohydrate, (MACROBID) 100 MG capsule   Oral   Take 100 mg by mouth 2 (two) times daily.           Allergies Review of patient's allergies indicates no known allergies.  No family history on file.  Social History Social History  Substance Use Topics  . Smoking status: Former Games developer  . Smokeless tobacco: None  . Alcohol Use: No    Review of Systems Per daughter and caregiver Constitutional: Negative for fever. Eyes: Negative for visual changes.  ENT: Negative for sore throat. Cardiovascular: Negative for chest pain. Respiratory: Negative for shortness of breath. Gastrointestinal: Negative for abdominal pain, vomiting and diarrhea. Genitourinary: Negative for dysuria. Musculoskeletal: Negative for back pain. Skin: Negative for rash. Neurological: Negative for headache. 10 point Review of Systems otherwise negative ____________________________________________   PHYSICAL EXAM:  VITAL SIGNS: ED Triage Vitals  Enc Vitals Group     BP 02/19/16 1018 100/68 mmHg     Pulse Rate 02/19/16 1018 88     Resp 02/19/16 1018 18     Temp 02/19/16 1018 97.9 F (36.6 C)     Temp Source 02/19/16 1018 Oral     SpO2 02/19/16 1018 94 %     Weight 02/19/16 1018 112 lb (50.803 kg)      Height 02/19/16 1018 5\' 5"  (1.651 m)     Head Cir --      Peak Flow --      Pain Score --      Pain Loc --      Pain Edu? --      Excl. in GC? --      Constitutional: Alert And cooperative, but poor historian she is disoriented. Well appearing overall and in no distress. HEENT   Head: Normocephalic.  Ecchymosis across the face, old.      Eyes: Conjunctivae are normal. PERRL. Normal extraocular movements.      Ears:         Nose: No congestion/rhinnorhea.   Mouth/Throat: Mucous membranes are moist.   Neck: No stridor. Cardiovascular/Chest: Normal rate, regular rhythm.  No murmurs, rubs, or gallops. Respiratory: Normal respiratory effort without tachypnea nor retractions. Breath sounds are clear and equal bilaterally. No wheezes/rales/rhonchi. Gastrointestinal: Soft. No distention, no guarding, no rebound. Nontender.    Genitourinary/rectal:Deferred Musculoskeletal: Nontender with normal range of motion in all extremities. No joint effusions.  No lower extremity tenderness.  No edema. Neurologic: No facial droop. No gross or focal neurologic deficits are appreciated. Skin:  Skin is warm, dry and intact. No rash noted. Psychiatric: No active hallucinations.  ____________________________________________   EKG I, Governor Rooksebecca Vilda Zollner, MD, the attending physician have personally viewed and interpreted all ECGs.  79 bpm. Incomplete right bundle blanch block. Nonspecific ST and T-wave ____________________________________________  LABS (pertinent positives/negatives)  Labs Reviewed  URINALYSIS COMPLETEWITH MICROSCOPIC ([facility] ONLY) - Abnormal; Notable for the following:    Color, Urine AMBER (*)    APPearance HAZY (*)    Protein, ur 30 (*)    Leukocytes, UA 2+ (*)    Bacteria, UA RARE (*)    Squamous Epithelial / LPF 0-5 (*)    All other components within normal limits  BASIC METABOLIC PANEL - Abnormal; Notable for the following:    Potassium 3.3 (*)    Glucose, Bld 104  (*)    Calcium 8.3 (*)    All other components within normal limits  CBC WITH DIFFERENTIAL/PLATELET - Abnormal; Notable for the following:    Platelets 142 (*)    Lymphs Abs 0.8 (*)    All other components within normal limits  URINE CULTURE  CULTURE, BLOOD (ROUTINE X 2)  CULTURE, BLOOD (ROUTINE X 2)  LACTIC ACID, PLASMA  TROPONIN I  LACTIC ACID, PLASMA     ____________________________________________  RADIOLOGY All Xrays were viewed by me. Imaging interpreted by Radiologist.  Chest: No acute cardiopulmonary disease __________________________________________  PROCEDURES  Procedure(s) performed: None  Critical Care performed: None  ____________________________________________   ED COURSE / ASSESSMENT AND PLAN  Pertinent  labs & imaging results that were available during my care of the patient were reviewed by me and considered in my medical decision making (see chart for details).   Daughter and caregiver brought this patient in for reevaluation after worsening generalized weakness and now a little bit of confusion which is worse than her typical dementia over the past one day, although the last 4 days she has been on treatment for urinary tract infection.  I too reviewed the culture results, and she is resistant to most oral antibiotics, and a number of IV antibiotics including Rocephin.  She has not taken her by mouth Macrobid dose today and I will go ahead and let her take this while awaiting additional results.  She has some borderline hypotension, without fever, which may be related to dehydration, but keeping open the possibility for potential infection/sepsis.  Unclear with a hypoxia at triage may be related to come no respiratory complaints. I will check a chest x-ray.  Chest x-ray unremarkable.  Urinalysis is consistent with ongoing/persistent urinary tract infection which indicates for outpatient therapy. Based on her prior antibiotic Cheree Ditto, she is going to  require IV Zosyn. Given the failed outpatient therapy of UTI with also generalized weakness and altered mental status, I will admit for IV antibiotic treatment.    CONSULTATIONS:   Hospitalist for admission   Patient / Family / Caregiver informed of clinical course, medical decision-making process, and agree with plan.  I distended to include after discussion with Dr. Nemiah Commander, hospitalist, and review of the sensitivity to antibiotics, Zosyn was not given, and patient was given imipenem. ___________________________________________   FINAL CLINICAL IMPRESSION(S) / ED DIAGNOSES   Final diagnoses:  Urinary tract infection without hematuria, site unspecified  Confusion  Generalized weakness              Note: This dictation was prepared with Dragon dictation. Any transcriptional errors that result from this process are unintentional   Governor Rooks, MD 02/19/16 1404  Governor Rooks, MD 02/19/16 1407

## 2016-02-19 NOTE — ED Notes (Signed)
Pt arrived via EMS from home for reports of witnessed fall this morning and increasing confusion and weakness. Pt daughter states was seen four days ago for fall and UTI. EMS reports 88% RA, 93% 2 L, CBG 80, hypotensive. Pt seen by PCP yesterday for multiple complaints.

## 2016-02-19 NOTE — Progress Notes (Signed)
Pharmacy Antibiotic Note  Victoria Woods is a 82 y.o. female admitted on 02/19/2016 with ESBL E coli UTI who apparently failed outpatient Macrobid. Pharmacy has been consulted for meropenem dosing.  Plan: Patient received imipenem x 1. Will begin meropenem 1 g iv q 12 hours starting 6 hours after imipenem dose.    Height: 5\' 5"  (165.1 cm) Weight: 112 lb (50.803 kg) IBW/kg (Calculated) : 57  Temp (24hrs), Avg:97.9 F (36.6 C), Min:97.9 F (36.6 C), Max:97.9 F (36.6 C)   Recent Labs Lab 02/15/16 1250 02/18/16 1444 02/19/16 1212  WBC 5.9 7.2 7.5  CREATININE 0.86 0.78 0.65  LATICACIDVEN  --   --  1.0    Estimated Creatinine Clearance: 36 mL/min (by C-G formula based on Cr of 0.65).    No Known Allergies  Antimicrobials this admission: Imipenem 5/24 >>  meropenem 5/24 >>   Dose adjustments this admission:   Microbiology results: 5/24 BCx: pending 5/24 UCx: pendintg  5/20 UCx: ESBL E coli sensitive to Zosyn, imipenem, nitrofurantoin   Thank you for allowing pharmacy to be a part of this patient's care.  Victoria Woods, Victoria Woods D 02/19/2016 3:21 PM

## 2016-02-19 NOTE — H&P (Signed)
[facility] - [place] at [facility]   PATIENT NAME: Victoria Woods    MR#:  [id number]  DATE OF BIRTH:  1921-01-08  DATE OF ADMISSION:  02/19/2016  PRIMARY CARE PHYSICIAN: Elizabeth Sauer, MD   REQUESTING/REFERRING PHYSICIAN: Dr. Governor Rooks  CHIEF COMPLAINT:   Chief Complaint  Patient presents with  . Fall    HISTORY OF PRESENT ILLNESS:  Victoria Woods  is a 82 y.o. female with a known history of Dementia, history of anemia who stays at home that around the clock caregivers was brought in secondary to worsening confusion and weakness. Patient is unable to provide any history at this time. Daughter at bedside and also caregiver at bedside the most of the history. Patient has short-term memory loss, but she is very interactive, conversational at baseline. She has a walker to ambulate but she often forgets to use it and has been having some falls lately. 5 days ago she was weak and had a fall and came to the emergency room. Labs showed UTI and she was given antibiotics. She followed up with her PCP yesterday and was still doing good. Her confusion and weakness worse and she had another fall today. She was very lethargic, decreased appetite and so presented to the emergency room. No nausea or vomiting. No other complaints. Urine cultures from few days ago growing resistant Escherichia coli. So she is being admitted for IV antibiotics and metabolic encephalopathy.  PAST MEDICAL HISTORY:   Past Medical History  Diagnosis Date  . Anemia   . Dementia   . Falls     PAST SURGICAL HISTORY:   Past Surgical History  Procedure Laterality Date  . Appendectomy    . Vaginal hysterectomy    . Tonsillectomy    . Partial hip arthroplasty Left     SOCIAL HISTORY:   Social History  Substance Use Topics  . Smoking status: Former Games developer  . Smokeless tobacco: Not on file     Comment: quit several years ago  . Alcohol Use: No    FAMILY HISTORY:  No family  history on file.  DRUG ALLERGIES:  No Known Allergies  REVIEW OF SYSTEMS:   Review of Systems  Unable to perform ROS: dementia    MEDICATIONS AT HOME:   Prior to Admission medications   Medication Sig Start Date End Date Taking? Authorizing Provider  carbamazepine (TEGRETOL) 100 MG chewable tablet Chew 100 mg by mouth 2 (two) times daily. 01/28/16  Yes Historical Provider, MD  Cholecalciferol (VITAMIN D3) 1000 units CAPS Take 1,000 Units by mouth daily.   Yes Historical Provider, MD  donepezil (ARICEPT) 10 MG tablet Take 10 mg by mouth daily.   Yes Historical Provider, MD  iron polysaccharides (FERREX 150) 150 MG capsule Take 150 mg by mouth daily.  12/13/13  Yes Historical Provider, MD  Multiple Vitamin (MULTI VITAMIN) TABS Take 1 tablet by mouth daily.   Yes Historical Provider, MD  nitrofurantoin, macrocrystal-monohydrate, (MACROBID) 100 MG capsule Take 100 mg by mouth 2 (two) times daily. 02/18/16 02/24/16 Yes Historical Provider, MD      VITAL SIGNS:  Blood pressure 102/58, pulse 68, temperature 97.9 F (36.6 C), temperature source Oral, resp. rate 18, height  (1.651 m), weight 50.803 kg (112 lb), SpO2 96 %.  PHYSICAL EXAMINATION:   Physical Exam  GENERAL:  82 y.o.-year-old patient lying in the bed with no acute distress.  EYES: Pupils equal, round, reactive to light and accommodation. No scleral icterus. Extraocular muscles  intact.  HEENT: Head normocephalic. Oropharynx and nasopharynx clear. Extensive bruising noted on the face secondary to recent fall. NECK:  Supple, no jugular venous distention. No thyroid enlargement, no tenderness.  LUNGS: Normal breath sounds bilaterally, no wheezing, rales,rhonchi or crepitation. No use of accessory muscles of respiration.  CARDIOVASCULAR: S1, S2 normal. No rubs, or gallops. 3/6 systolic murmur is present and ABDOMEN: Soft, nontender, nondistended. Bowel sounds present. No organomegaly or mass.  EXTREMITIES: No pedal edema,  cyanosis, or clubbing.  NEUROLOGIC: Cranial nerves II through XII are intact. Muscle strength 5/5 in all extremities. Sensation intact. Gait not checked. Following simple commands. PSYCHIATRIC: The patient is alert and oriented x2.  SKIN: No obvious rash, lesion, or ulcer.   LABORATORY PANEL:   CBC  Recent Labs Lab 02/19/16 1212  WBC 7.5  HGB 13.1  HCT 38.7  PLT 142*   ------------------------------------------------------------------------------------------------------------------  Chemistries   Recent Labs Lab 02/19/16 1212  NA 138  K 3.3*  CL 103  CO2 28  GLUCOSE 104*  BUN 14  CREATININE 0.65  CALCIUM 8.3*   ------------------------------------------------------------------------------------------------------------------  Cardiac Enzymes  Recent Labs Lab 02/19/16 1212  TROPONINI <0.03   ------------------------------------------------------------------------------------------------------------------  RADIOLOGY:  Dg Chest 2 View  02/19/2016  CLINICAL DATA:  Hypoxia EXAM: CHEST  2 VIEW COMPARISON:  Yesterday FINDINGS: Chronic cardiomegaly. Stable aortic tortuosity. Low volume chest with interstitial crowding. There is no edema, consolidation, effusion, or pneumothorax. Anterior right sixth rib fracture as reported yesterday. IMPRESSION: No evidence of acute cardiopulmonary disease. Stable since yesterday. Electronically Signed   By: Marnee Spring M.D.   On: 02/19/2016 13:34   Dg Chest 2 View  02/18/2016  CLINICAL DATA:  Status post fall with hand and facial bruising. EXAM: CHEST  2 VIEW COMPARISON:  Chest x-ray of September 16, 2015 FINDINGS: The lungs are adequately inflated. There is no focal infiltrate nor evidence of a pulmonary contusion. There is no pleural effusion or pneumothorax. There is a fracture of the anterolateral aspect of the right sixth rib. The heart is mildly enlarged. The pulmonary vascularity is not engorged. There is tortuosity of the  ascending and descending thoracic aorta. The thoracic vertebral bodies are preserved in height. IMPRESSION: Cardiomegaly. No pulmonary vascular congestion nor other acute cardiopulmonary abnormality. Fracture of the anterolateral aspect of the right sixth rib. Electronically Signed   By: David  Swaziland M.D.   On: 02/18/2016 16:00   Dg Hand Complete Right  02/18/2016  CLINICAL DATA:  Status post fall onto the right hand with generalized swelling and bruising ; the patient has altered mental status. EXAM: RIGHT HAND - COMPLETE 3+ VIEW COMPARISON:  Right wrist of October 13, 2012 FINDINGS: The bones of the right hand are osteopenic. There is an acute fracture involving the base of the proximal phalanx of the fourth finger with mild angulation. The other phalanges are intact. There is no acute metacarpal fracture but there is old deformity of the shafts of the 3rd to 5th metacarpals. There are severe osteoarthritic changes of the interphalangeal joint of the thumb and of the DIP joints of the index and middle fingers and PIP joint of the fourth finger. There is mild osteoarthritic change of the metacarpophalangeal joints. Moderate osteoarthritic changes of the CMC joints especially of the first [facility] joint is observed. The intercarpal joints are narrowed and cannot be well evaluated on this study. There is chronic narrowing of the radiocarpal joint an old deformity of the distal radial metaphysis from a fracture image in January 2014.  IMPRESSION: 1. There is an acute mildly angulated fracture of the base of the shaft of the proximal phalanx of the right fourth finger. 2. Moderate to severe osteoarthritic changes of the interphalangeal and carpometacarpal joints with milder changes of the metacarpophalangeal joints. Electronically Signed   By: David  SwazilandJordan M.D.   On: 02/18/2016 15:53    EKG:   Orders placed or performed during the hospital encounter of 02/19/16  . ED EKG  . ED EKG    IMPRESSION AND PLAN:    Victoria Woods  is a 82 y.o. female with a known history of Dementia, history of anemia who stays at home that around the clock caregivers was brought in secondary to worsening confusion and weakness.  #1 UTI-urine cultures recently drying resistant Escherichia coli. Sensitive to Zosyn and imipenem. Based on MICs and since she is symptomatic- failed outpatient macrobid, start meropenem and monitor - IV fluids - repeat UA  #2 hypokalemia-being replaced  #3 confusion-metabolic encephalopathy secondary to infection. IV antibiotics, fluids and physical therapy  #4 dementia-continue Aricept  #5 DVT prophylaxis-on Lovenox  Physical therapy and care management consults    All the records are reviewed and case discussed with ED provider. Management plans discussed with the patient, family and they are in agreement.  CODE STATUS:Full code  TOTAL TIME TAKING CARE OF THIS PATIENT: 50 minutes.    Enid BaasKALISETTI,Gerard Cantara M.D on 02/19/2016 at 2:57 PM  Between 7am to 6pm - Pager - [phone]  After 6pm go to [email or web address] - password [email or web address] [facility]  [facility] [place] [facility]  Office  [phone]  CC: Primary care physician; Elizabeth Sauereanna Jones, MD

## 2016-02-19 NOTE — ED Notes (Signed)
Patient transported to X-ray 

## 2016-02-20 LAB — CBC
HCT: 36.7 % (ref 35.0–47.0)
HEMOGLOBIN: 12.6 g/dL (ref 12.0–16.0)
MCH: 30.4 pg (ref 26.0–34.0)
MCHC: 34.4 g/dL (ref 32.0–36.0)
MCV: 88.4 fL (ref 80.0–100.0)
Platelets: 150 10*3/uL (ref 150–440)
RBC: 4.15 MIL/uL (ref 3.80–5.20)
RDW: 13.5 % (ref 11.5–14.5)
WBC: 7 10*3/uL (ref 3.6–11.0)

## 2016-02-20 LAB — BASIC METABOLIC PANEL
Anion gap: 4 — ABNORMAL LOW (ref 5–15)
BUN: 11 mg/dL (ref 6–20)
CALCIUM: 7.8 mg/dL — AB (ref 8.9–10.3)
CO2: 27 mmol/L (ref 22–32)
CREATININE: 0.66 mg/dL (ref 0.44–1.00)
Chloride: 109 mmol/L (ref 101–111)
GFR calc Af Amer: >60 mL/min (ref >60)
GLUCOSE: 100 mg/dL — AB (ref 65–99)
Potassium: 3.6 mmol/L (ref 3.5–5.1)
Sodium: 140 mmol/L (ref 135–145)

## 2016-02-20 NOTE — Progress Notes (Signed)
Dr. Sampson GoonFitzgerald consulted today, per office staff he is on vacation until Tuesday. Dr. Imogene Burnhen notified and family informed that he will not be rounding this evening, but ID [facility] from [facility] paged to receive information about consult. No call back since page, Dr. Imogene Burnhen stated that he will get in contact with [facility] ID MD tomorrow morning. Patient is stable at this time and does not need consult tonight per Dr. Imogene Burnhen. Trudee KusterBrandi R Mansfield

## 2016-02-20 NOTE — Consult Note (Signed)
Patient is seen for treatment of a right ring finger fracture. She has significant confusion and does not really have any idea when this occurred. She had x-rays a few days ago that showed this and then she is subsequently had another fall and was admitted. She is right-hand dominant and has caretakers with her [phone] with her daughter.     On exam she is noted to have ecchymosis to the hand but is able to move the fingers quite well. Patient patient is confused and unable to Neuro exam is bivalved but she did not complain of a numbness to light touch   Radiographs were reviewed and show a apex volar angulation fracture the base of the proximal phalanx of the right ring finger, significant degenerative arthrosis present within the hand   Impression: Right ring proximal phalanx fracture  Plan Buddy taping and follow up in 3 weeks for an x-ray expected to be healed at that time and can discontinue buddy taping at that time

## 2016-02-20 NOTE — Progress Notes (Signed)
[facility] - [place] at [facility]   PATIENT NAME: Victoria Woods    MR#:  [id number]  DATE OF BIRTH:  [birth year]  SUBJECTIVE:  CHIEF COMPLAINT:   Chief Complaint  Patient presents with  . Fall   No complaint except weakness REVIEW OF SYSTEMS:  CONSTITUTIONAL: No fever, has generalized weakness.  EYES: No blurred or double vision.  EARS, NOSE, AND THROAT: No tinnitus or ear pain.  RESPIRATORY: No cough, shortness of breath, wheezing or hemoptysis.  CARDIOVASCULAR: No chest pain, orthopnea, edema.  GASTROINTESTINAL: No nausea, vomiting, diarrhea or abdominal pain.  GENITOURINARY: No dysuria, hematuria.  ENDOCRINE: No polyuria, nocturia,  HEMATOLOGY: No anemia, easy bruising or bleeding SKIN: No rash or lesion. MUSCULOSKELETAL: No joint pain or arthritis.   NEUROLOGIC: No tingling, numbness, weakness.  PSYCHIATRY: No anxiety or depression.   DRUG ALLERGIES:  No Known Allergies  VITALS:  Blood pressure 102/66, pulse 70, temperature 98.1 F (36.7 C), temperature source Oral, resp. rate 19, height 5\' 5"  (1.651 m), weight 112 lb (50.803 kg), SpO2 94 %.  PHYSICAL EXAMINATION:  GENERAL:  82 y.o.-year-old patient lying in the bed with no acute distress.  EYES: Pupils equal, round, reactive to light and accommodation. No scleral icterus. Extraocular muscles intact.  HEENT: Head atraumatic, normocephalic. Oropharynx and nasopharynx clear.  NECK:  Supple, no jugular venous distention. No thyroid enlargement, no tenderness.  LUNGS: Normal breath sounds bilaterally, no wheezing, rales,rhonchi or crepitation. No use of accessory muscles of respiration.  CARDIOVASCULAR: S1, S2 normal. No murmurs, rubs, or gallops.  ABDOMEN: Soft, nontender, nondistended. Bowel sounds present. No organomegaly or mass.  EXTREMITIES: No pedal edema, cyanosis, or clubbing.  NEUROLOGIC: Cranial nerves II through XII are intact. Muscle strength 5/5 in all extremities. Sensation  intact. Gait not checked.  PSYCHIATRIC: The patient is alert and oriented x 2.  SKIN: No obvious rash, lesion, or ulcer.    LABORATORY PANEL:   CBC  Recent Labs Lab 02/20/16 0423  WBC 7.0  HGB 12.6  HCT 36.7  PLT 150   ------------------------------------------------------------------------------------------------------------------  Chemistries   Recent Labs Lab 02/20/16 0423  NA 140  K 3.6  CL 109  CO2 27  GLUCOSE 100*  BUN 11  CREATININE 0.66  CALCIUM 7.8*   ------------------------------------------------------------------------------------------------------------------  Cardiac Enzymes  Recent Labs Lab 02/19/16 1212  TROPONINI <0.03   ------------------------------------------------------------------------------------------------------------------  RADIOLOGY:  Dg Chest 2 View  02/19/2016  CLINICAL DATA:  Hypoxia EXAM: CHEST  2 VIEW COMPARISON:  Yesterday FINDINGS: Chronic cardiomegaly. Stable aortic tortuosity. Low volume chest with interstitial crowding. There is no edema, consolidation, effusion, or pneumothorax. Anterior right sixth rib fracture as reported yesterday. IMPRESSION: No evidence of acute cardiopulmonary disease. Stable since yesterday. Electronically Signed   By: Marnee SpringJonathon  Watts M.D.   On: 02/19/2016 13:34   Dg Chest 2 View  02/18/2016  CLINICAL DATA:  Status post fall with hand and facial bruising. EXAM: CHEST  2 VIEW COMPARISON:  Chest x-ray of September 16, 2015 FINDINGS: The lungs are adequately inflated. There is no focal infiltrate nor evidence of a pulmonary contusion. There is no pleural effusion or pneumothorax. There is a fracture of the anterolateral aspect of the right sixth rib. The heart is mildly enlarged. The pulmonary vascularity is not engorged. There is tortuosity of the ascending and descending thoracic aorta. The thoracic vertebral bodies are preserved in height. IMPRESSION: Cardiomegaly. No pulmonary vascular congestion nor  other acute cardiopulmonary abnormality. Fracture of the anterolateral aspect of the right sixth  rib. Electronically Signed   By: David  [place]il[place] M.D.   On: 02/18/2016 16:00   Dg Hand Complete Right  02/18/2016  CLINICAL DATA:  Status post fall onto the right hand with generalized swelling and bruising ; the patient has altered mental status. EXAM: RIGHT HAND - COMPLETE 3+ VIEW COMPARISON:  Right wrist of October 13, 2012 FINDINGS: The bones of the right hand are osteopenic. There is an acute fracture involving the base of the proximal phalanx of the fourth finger with mild angulation. The other phalanges are intact. There is no acute metacarpal fracture but there is old deformity of the shafts of the 3rd to 5th metacarpals. There are severe osteoarthritic changes of the interphalangeal joint of the thumb and of the DIP joints of the index and middle fingers and PIP joint of the fourth finger. There is mild osteoarthritic change of the metacarpophalangeal joints. Moderate osteoarthritic changes of the CMC joints especially of the first [facility] joint is observed. The intercarpal joints are narrowed and cannot be well evaluated on this study. There is chronic narrowing of the radiocarpal joint an old deformity of the distal radial metaphysis from a fracture image in January 2014. IMPRESSION: 1. There is an acute mildly angulated fracture of the base of the shaft of the proximal phalanx of the right fourth finger. 2. Moderate to severe osteoarthritic changes of the interphalangeal and carpometacarpal joints with milder changes of the metacarpophalangeal joints. Electronically Signed   By: David  [place]il[place] M.D.   On: 02/18/2016 15:53    EKG:   Orders placed or performed during the hospital encounter of 02/19/16  . ED EKG  . ED EKG    ASSESSMENT AND PLAN:   Victoria Woods is a 82 y.o. female with a known history of Dementia, history of anemia who stays at home that around the clock caregivers was brought in  secondary to worsening confusion and weakness.  #1 UTI,  recent urine culture showed ESBL.  Continue meropenem, ID consult.  #2 hypokalemia-improved with replacement.  #3 Acutemetabolic encephalopathy secondary to UTI. Improved to baseline with IV antibiotics and fluids.  #4 dementia-continue Aricept  #5 DVT prophylaxis-on Lovenox  Physical therapy: HHPT.  All the records are reviewed and case discussed with Care Management/Social Workerr. Management plans discussed with the patient, her daughter and they are in agreement.  CODE STATUS: full code.  TOTAL TIME TAKING CARE OF THIS PATIENT: 38 minutes.  Greater than 50% time was spent on coordination of care and face-to-face counseling.  POSSIBLE D/C IN 2 DAYS, DEPENDING ON CLINICAL CONDITION.   Shaune Pollack M.D on 02/20/2016 at 3:31 PM  Between 7am to 6pm - Pager - [phone]  After 6pm go to [email or web address] - password [email or web address] [facility]  [place] [facility]  Office  [phone]  CC: Primary care physician; Elizabeth Sauer, MD

## 2016-02-20 NOTE — Evaluation (Signed)
Physical Therapy Evaluation Patient Details Name: Victoria AlbinoMargaret R Molden MRN: [id number] DOB: 1920-12-09 Today's Date: 02/20/2016   History of Present Illness  Pt admitted for fall with complaints of confusion and weakness.  Clinical Impression  Pt is a pleasant 82 year old female who was admitted for falls. Pt is slightly HOH and confused. She looks to daughter who is able to provide history and answer questions. Pt performs bed mobility, transfers, and ambulation with min assist and HHA. This is her baseline level. Per daughter, they have RW in home but pt does not use. Would recommend using RW to improve balance/stability/endurance.  Pt demonstrates deficits with mobility/strength. Would benefit from skilled PT to address above deficits and promote optimal return to PLOF. Pt has 24/7 caregivers in the home. Recommend transition to HHPT upon discharge from acute hospitalization.       Follow Up Recommendations Home health PT;Supervision/Assistance - 24 hour    Equipment Recommendations       Recommendations for Other Services       Precautions / Restrictions Precautions Precautions: Fall Restrictions Weight Bearing Restrictions: No      Mobility  Bed Mobility Overal bed mobility: Needs Assistance Bed Mobility: Supine to Sit     Supine to sit: Min assist     General bed mobility comments: assist for bed mobility including cues for sequencing and assist for trunk support. Once seated at EOB, pt able to sit with supervision  Transfers Overall transfer level: Needs assistance Equipment used: 1 person hand held assist Transfers: Sit to/from Stand Sit to Stand: Min assist         General transfer comment: assist for upright posture, Pt unsteady and needs HHA for stability.  Ambulation/Gait Ambulation/Gait assistance: Min assist Ambulation Distance (Feet): 6 Feet Assistive device: 1 person hand held assist Gait Pattern/deviations: Step-to pattern     General Gait  Details: ambulated with small step to gait pattern. Pt with unsteadiness noted and cues for safety. Would recommend trial of rw to improve balance. Pt amb[facility] to [facility] and then to recliner.  Stairs            Wheelchair Mobility    Modified Rankin (Stroke Patients Only)       Balance Overall balance assessment: Needs assistance Sitting-balance support: Feet supported Sitting balance-Leahy Scale: Fair     Standing balance support: Single extremity supported Standing balance-Leahy Scale: Fair                               Pertinent Vitals/Pain Pain Assessment: No/denies pain    Home Living Family/patient expects to be discharged to:: Private residence Living Arrangements: Alone Available Help at Discharge: Available 24 hours/day Type of Home: House Home Access: Stairs to enter Entrance Stairs-Rails: Right Entrance Stairs-Number of Steps: 4 Home Layout: One level Home Equipment: Environmental consultantWalker - 2 wheels      Prior Function Level of Independence: Needs assistance   Gait / Transfers Assistance Needed: needs assist for ADLs secondary to confusion           Hand Dominance        Extremity/Trunk Assessment   Upper Extremity Assessment:  (R LE grossly 3/5 and painful; L LE grossly 4/5)           Lower Extremity Assessment: Generalized weakness (B LE grossly 3+/5)         Communication   Communication: No difficulties  Cognition Arousal/Alertness: Awake/alert Behavior During Therapy:  Restless Overall Cognitive Status: History of cognitive impairments - at baseline                      General Comments      Exercises Other Exercises Other Exercises: Pt able to perform supine ther-ex including ankle pumps, SLRs, hip abd/add, and SAQ. All ther-ex performed x 10 reps with min assist for correct technique Other Exercises: Pt ambulated to [facility] with min assist. Pt cued for safety during transfer and to reach for hand rails prior to sitting  down. Pt able to perform hygiene with cga. Min assist for donning diaper.      Assessment/Plan    PT Assessment Patient needs continued PT services  PT Diagnosis Difficulty walking;Generalized weakness   PT Problem List Decreased strength;Decreased activity tolerance;Decreased balance;Decreased mobility  PT Treatment Interventions Gait training;Therapeutic exercise   PT Goals (Current goals can be found in the Care Plan section) Acute Rehab PT Goals Patient Stated Goal: to get stronger PT Goal Formulation: With patient Time For Goal Achievement: 03/05/16 Potential to Achieve Goals: Good    Frequency Min 2X/week   Barriers to discharge        Co-evaluation               End of Session Equipment Utilized During Treatment: Gait belt Activity Tolerance: Patient tolerated treatment well Patient left: in chair;with chair alarm set Nurse Communication: Mobility status         Time: 5409-8119 PT Time Calculation (min) (ACUTE ONLY): 31 min   Charges:   PT Evaluation $PT Eval Moderate Complexity: 1 Procedure PT Treatments $Therapeutic Exercise: 8-22 mins $Therapeutic Activity: 8-22 mins   PT G Codes:        Demetries Coia 03-09-16, 1:36 PM  Elizabeth Palau, PT, DPT [phone]

## 2016-02-20 NOTE — Care Management Note (Signed)
Case Management Note  Patient Details  Name: Victoria Woods MRN: [id number] Date of Birth: [birth year]  Subjective/Objective:                  Met with patient and [name]) to discuss discharge planning. Patient has hired care givers 24/7 at home. She has a rollator and front-wheeled walker available at home but "doesn't use it". Her PCP is Dr. Dorise Hiss. Daughter agrees with SNF/HHPT.   Action/Plan:  List of home health agencies left with daughter. RNCM will continue to follow.   Expected Discharge Date:                  Expected Discharge Plan:     In-House Referral:     Discharge planning Services  CM Consult  Post Acute Care Choice:  Home Health Choice offered to:  Patient, Adult Children  DME Arranged:    DME Agency:     HH Arranged:    [place] Agency:     Status of Service:  In process, will continue to follow  Medicare Important Message Given:    Date Medicare IM Given:    Medicare IM give by:    Date Additional Medicare IM Given:    Additional Medicare Important Message give by:     If discussed at [place] of Stay Meetings, dates discussed:    Additional Comments:  Marshell Garfinkel, RN 02/20/2016, 11:51 AM

## 2016-02-21 LAB — URINE CULTURE

## 2016-02-21 MED ORDER — DIPHENHYDRAMINE HCL 25 MG PO CAPS
25.0000 mg | ORAL_CAPSULE | Freq: Every evening | ORAL | Status: DC | PRN
  Administered 2016-02-21: 25 mg via ORAL
  Filled 2016-02-21: qty 1

## 2016-02-21 MED ORDER — ACETAMINOPHEN 325 MG PO TABS
650.0000 mg | ORAL_TABLET | Freq: Every evening | ORAL | Status: DC | PRN
  Administered 2016-02-21: 650 mg via ORAL
  Filled 2016-02-21: qty 2

## 2016-02-21 MED ORDER — DIPHENHYDRAMINE-APAP (SLEEP) 25-500 MG PO TABS: 1.0000 | ORAL_TABLET | Freq: Every evening | ORAL | Status: DC | PRN

## 2016-02-21 NOTE — Progress Notes (Signed)
[facility] - [place] at [facility]   PATIENT NAME: Victoria Woods    MR#:  [id number]  DATE OF BIRTH:  1921/06/29  SUBJECTIVE:  CHIEF COMPLAINT:   Chief Complaint  Patient presents with  . Fall   No complaint. REVIEW OF SYSTEMS:  CONSTITUTIONAL: No fever, no weakness.  EYES: No blurred or double vision.  EARS, NOSE, AND THROAT: No tinnitus or ear pain.  RESPIRATORY: No cough, shortness of breath, wheezing or hemoptysis.  CARDIOVASCULAR: No chest pain, orthopnea, edema.  GASTROINTESTINAL: No nausea, vomiting, diarrhea or abdominal pain.  GENITOURINARY: No dysuria, hematuria.  ENDOCRINE: No polyuria, nocturia,  HEMATOLOGY: No anemia, easy bruising or bleeding SKIN: No rash or lesion. MUSCULOSKELETAL: No joint pain or arthritis.   NEUROLOGIC: No tingling, numbness, weakness.  PSYCHIATRY: No anxiety or depression.   DRUG ALLERGIES:  No Known Allergies  VITALS:  Blood pressure 98/58, pulse 88, temperature 97.3 F (36.3 C), temperature source Oral, resp. rate 18, height  (1.651 m), weight 112 lb (50.803 kg), SpO2 92 %.  PHYSICAL EXAMINATION:  GENERAL:  82 y.o.-year-old patient lying in the bed with no acute distress.  EYES: Pupils equal, round, reactive to light and accommodation. No scleral icterus. Extraocular muscles intact.  HEENT: Head atraumatic, normocephalic. Oropharynx and nasopharynx clear.  NECK:  Supple, no jugular venous distention. No thyroid enlargement, no tenderness.  LUNGS: Normal breath sounds bilaterally, no wheezing, rales,rhonchi or crepitation. No use of accessory muscles of respiration.  CARDIOVASCULAR: S1, S2 normal. No murmurs, rubs, or gallops.  ABDOMEN: Soft, nontender, nondistended. Bowel sounds present. No organomegaly or mass.  EXTREMITIES: No pedal edema, cyanosis, or clubbing.  NEUROLOGIC: Cranial nerves II through XII are intact. Muscle strength 5/5 in all extremities. Sensation intact. Gait not checked.   PSYCHIATRIC: The patient is alert and oriented x 2.  SKIN: No obvious rash, lesion, or ulcer.    LABORATORY PANEL:   CBC  Recent Labs Lab 02/20/16 0423  WBC 7.0  HGB 12.6  HCT 36.7  PLT 150   ------------------------------------------------------------------------------------------------------------------  Chemistries   Recent Labs Lab 02/20/16 0423  NA 140  K 3.6  CL 109  CO2 27  GLUCOSE 100*  BUN 11  CREATININE 0.66  CALCIUM 7.8*   ------------------------------------------------------------------------------------------------------------------  Cardiac Enzymes  Recent Labs Lab 02/19/16 1212  TROPONINI <0.03   ------------------------------------------------------------------------------------------------------------------  RADIOLOGY:  No results found.  EKG:   Orders placed or performed during the hospital encounter of 02/19/16  . ED EKG  . ED EKG    ASSESSMENT AND PLAN:   Victoria Woods is a 82 y.o. female with a known history of Dementia, history of anemia who stays at home that around the clock caregivers was brought in secondary to worsening confusion and weakness.  #1 UTI,  recent urine culture showed ESBL.  Continue meropenem,  Follow up urine culture (recollected).  #2 hypokalemia-improved with replacement.  #3 Acutemetabolic encephalopathy secondary to UTI. Improved to baseline with IV antibiotics and fluids.  #4 dementia-continue Aricept  #5 DVT prophylaxis-on Lovenox  Physical therapy: HHPT.  All the records are reviewed and case discussed with Care Management/Social Workerr. Management plans discussed with the patient, her 2 daughters and they are in agreement.  CODE STATUS: full code.  TOTAL TIME TAKING CARE OF THIS PATIENT: 33 minutes.  Greater than 50% time was spent on coordination of care and face-to-face counseling.  POSSIBLE D/C IN 2 DAYS, DEPENDING ON CLINICAL CONDITION.   Shaune Pollack M.D on 02/21/2016 at 2:49  PM  Between 7am to 6pm - Pager - [phone]  After 6pm go to [email or web address] - password [email or web address]  [facility]  Office  [phone]  CC: Primary care physician; Elizabeth Sauereanna Jones, MD

## 2016-02-21 NOTE — Progress Notes (Signed)
Pharmacy Antibiotic Note  Victoria AlbinoMargaret R Woods is a 82 y.o. female admitted on 02/19/2016 with ESBL E coli UTI who apparently failed outpatient Macrobid. Pharmacy has been consulted for meropenem dosing.  Plan: Continue meropenem 1gm IV Q12H  Urine cultures with  Multiple species, ID consulted   Height: 5\' 5"  (165.1 cm) Weight: 112 lb (50.803 kg) IBW/kg (Calculated) : 57  Temp (24hrs), Avg:98 F (36.7 C), Min:97.3 F (36.3 C), Max:98.5 F (36.9 C)   Recent Labs Lab 02/15/16 1250 02/18/16 1444 02/19/16 1212 02/20/16 0423  WBC 5.9 7.2 7.5 7.0  CREATININE 0.86 0.78 0.65 0.66  LATICACIDVEN  --   --  1.0  --     Estimated Creatinine Clearance: 36 mL/min (by C-G formula based on Cr of 0.66).    No Known Allergies  Antimicrobials this admission: Imipenem 5/24 = meropenem 5/24 >>   Dose adjustments this admission:   Microbiology results: 5/24 BCx: pending 5/24 UCx: multiple species 5/20 UCx: ESBL E coli sensitive to Zosyn, imipenem, nitrofurantoin   Thank you for allowing pharmacy to be a part of this patient's care.  Abbegayle Denault C 02/21/2016 2:38 PM

## 2016-02-21 NOTE — Care Management Important Message (Signed)
Important Message  Patient Details  Name: Victoria Woods MRN: [id number] Date of Birth: 27-Jul-1921   Medicare Important Message Given:  Yes    Collie SiadAngela Spike Desilets, RN 02/21/2016, 11:37 AM

## 2016-02-21 NOTE — Care Management (Signed)
Spoke with patient's HCPOA/daughter regarding HHPT recommendation. Patient had [facility] in the past and they would like them again. [facility] RNCM will continue to follow.

## 2016-02-21 NOTE — Progress Notes (Signed)
pts daughter requesting that pt is given some medication to help her sleep tonight/ states that pt usually takes tylenol PM at home/ Dr. Imogene Burnhen paged to receive order

## 2016-02-22 ENCOUNTER — Inpatient Hospital Stay: Payer: Medicare Other

## 2016-02-22 MED ORDER — SODIUM CHLORIDE 0.9 % IV SOLN: 1.0000 g | INTRAVENOUS | Status: AC

## 2016-02-22 NOTE — Discharge Summary (Signed)
[facility] - [place] at [facility]   PATIENT NAME: Victoria [name]    MR#:  [id number]  DATE OF BIRTH:  [birth year]  DATE OF ADMISSION:  02/19/2016 ADMITTING PHYSICIAN: Enid Baas, MD  DATE OF DISCHARGE: *02/22/2016  PRIMARY CARE PHYSICIAN: Elizabeth Sauer, MD    ADMISSION DIAGNOSIS:  Confusion [R41.0] Generalized weakness [R53.1] Urinary tract infection without hematuria, site unspecified [N39.0]  DISCHARGE DIAGNOSIS:  Active Problems:   Confusion   SECONDARY DIAGNOSIS:   Past Medical History  Diagnosis Date  . Anemia   . Dementia   . Falls     HOSPITAL COURSE:   82 year old female with a history of dementia who is brought in for confusion and weakness and found to have ESBL urinary tract infection from recent urine culture.  1. Weakness and acute metabolic encephalopathy due to ESBL urinary tract infection: Patient appears to be at baseline.  2. ESBL urinary tract infection from culture on May 20. Patient was started on meropenem. Patient will be discharged on ertapenem which is once a day. Home Health care will be arranged and PICC line was placed prior to discharge.  3. Dementia: Patient continue Aricept.  DISCHARGE CONDITIONS AND DIET:   Stable for discharge on regular diet  CONSULTS OBTAINED:  Treatment Team:  Kennedy Bucker, MD Mick Sell, MD  DRUG ALLERGIES:  No Known Allergies  DISCHARGE MEDICATIONS:   Current Discharge Medication List    START taking these medications   Details  ertapenem 1 g in sodium chloride 0.9 % 50 mL Inject 1 g into the vein daily. Qty: 6 g, Refills: 0      CONTINUE these medications which have NOT CHANGED   Details  carbamazepine (TEGRETOL) 100 MG chewable tablet Chew 100 mg by mouth 2 (two) times daily.    Cholecalciferol (VITAMIN D3) 1000 units CAPS Take 1,000 Units by mouth daily.    donepezil (ARICEPT) 10 MG tablet Take 10 mg by mouth daily.    iron polysaccharides (FERREX 150) 150  MG capsule Take 150 mg by mouth daily.     Multiple Vitamin (MULTI VITAMIN) TABS Take 1 tablet by mouth daily.      STOP taking these medications     nitrofurantoin, macrocrystal-monohydrate, (MACROBID) 100 MG capsule               Today   CHIEF COMPLAINT:   No acute issues overnight. Patient doing well and no fevers.   VITAL SIGNS:  Blood pressure 118/58, pulse 82, temperature 96.8 F (36 C), temperature source Oral, resp. rate 18, height  (1.651 m), weight 50.803 kg (112 lb), SpO2 93 %.   REVIEW OF SYSTEMS:  Review of Systems  Constitutional: Negative for fever, chills and malaise/fatigue.  HENT: Negative for ear discharge, ear pain, hearing loss, nosebleeds and sore throat.   Eyes: Negative for blurred vision and pain.  Respiratory: Negative for cough, hemoptysis, shortness of breath and wheezing.   Cardiovascular: Negative for chest pain, palpitations and leg swelling.  Gastrointestinal: Negative for nausea, vomiting, abdominal pain, diarrhea and blood in stool.  Genitourinary: Negative for dysuria.  Musculoskeletal: Negative for back pain.  Neurological: Negative for dizziness, tremors, speech change, focal weakness, seizures and headaches.  Endo/Heme/Allergies: Does not bruise/bleed easily.  Psychiatric/Behavioral: Positive for memory loss. Negative for depression, suicidal ideas and hallucinations.     PHYSICAL EXAMINATION:  GENERAL:  82 y.o.-year-old patient lying in the bed with no acute distress.  NECK:  Supple, no jugular venous distention. No  thyroid enlargement, no tenderness.  LUNGS: Normal breath sounds bilaterally, no wheezing, rales,rhonchi  No use of accessory muscles of respiration.  CARDIOVASCULAR: S1, S2 normal. No murmurs, rubs, or gallops.  ABDOMEN: Soft, non-tender, non-distended. Bowel sounds present. No organomegaly or mass.  EXTREMITIES: No pedal edema, cyanosis, or clubbing.  PSYCHIATRIC: The patient is alert and oriented x Name  and place SKIN: No obvious rash, lesion, or ulcer.   DATA REVIEW:   CBC  Recent Labs Lab 02/20/16 0423  WBC 7.0  HGB 12.6  HCT 36.7  PLT 150    Chemistries   Recent Labs Lab 02/20/16 0423  NA 140  K 3.6  CL 109  CO2 27  GLUCOSE 100*  BUN 11  CREATININE 0.66  CALCIUM 7.8*    Cardiac Enzymes  Recent Labs Lab 02/19/16 1212  TROPONINI <0.03    Microbiology Results  @[email or web address] @  RADIOLOGY:  No results found.    Management plans discussed with the patient;s daughter and she is in agreement. Stable for discharge home with home health care and PICC line  Patient should follow up with PCP in 2 days  CODE STATUS:     Code Status Orders        Start     Ordered   02/19/16 1746  Full code   Continuous     02/19/16 1745    Code Status History    Date Active Date Inactive Code Status Order ID Comments User Context   This patient has a current code status but no historical code status.    Advance Directive Documentation        Most Recent Value   Type of Advance Directive  Healthcare Power of Attorney   Pre-existing out of facility DNR order (yellow form or pink MOST form)     "MOST" Form in Place?        TOTAL TIME TAKING CARE OF THIS PATIENT: 36 minutes.    Note: This dictation was prepared with Dragon dictation along with smaller phrase technology. Any transcriptional errors that result from this process are unintentional.  Lisel Siegrist M.D on 02/22/2016 at 12:32 PM  Between 7am to 6pm - Pager - [phone] After 6pm go to [email or web address] - Social research officer, [email or web address] [email or web address]  [facility] [place] [facility]  Office  [phone]  CC: Primary care physician; Elizabeth Sauereanna Jones, MD

## 2016-02-22 NOTE — Care Management Note (Addendum)
Case Management Note  Patient Details  Name: Victoria [name] MRN: [id number] Date of Birth: Jun 17, 1921  Subjective/Objective:   Discharge home today with home health and [name] daily IV antibiotic x 6 days. Daughter Victoria [name] who is also her POA [facility]. [facility] not have an available nurse this weekend to start the IV Invanz tomorrow, Mrs Victoria [name] chose [facility] who reports that they can provide a RN to teach the family how to administer the IV antibiotic starting tomorrow morning. [name] request for [name] bedside commode was faxed to [facility]. [name] referral for home health PT, RN, and aid was faxed to [facility]. Notes on all faxed requests to contact the daughter [name] for appointments and deliveries.              Action/Plan:   Expected Discharge Date:                  Expected Discharge Plan:     In-House Referral:     Discharge planning Services  CM Consult  Post Acute Care Choice:  Home Health Choice offered to:  Patient, Adult Children  DME Arranged:    DME Agency:     HH Arranged:    HH Agency:     Status of Service:  In process, will continue to follow  Medicare Important Message Given:  Yes Date Medicare IM Given:    Medicare IM give by:    Date Additional Medicare IM Given:    Additional Medicare Important Message give by:     If discussed at Long Length of Stay Meetings, dates discussed:    Additional Comments:  Victoria Stehle A, RN 02/22/2016, 1:38 PM

## 2016-02-23 LAB — URINE CULTURE: SPECIAL REQUESTS: NORMAL

## 2016-02-24 LAB — CULTURE, BLOOD (ROUTINE X 2)
CULTURE: NO GROWTH
CULTURE: NO GROWTH

## 2016-02-26 ENCOUNTER — Ambulatory Visit (INDEPENDENT_AMBULATORY_CARE_PROVIDER_SITE_OTHER): Payer: Medicare Other | Admitting: Family Medicine

## 2016-02-26 ENCOUNTER — Encounter: Payer: Self-pay | Admitting: Family Medicine

## 2016-02-26 VITALS — BP 110/64 | HR 64

## 2016-02-26 DIAGNOSIS — Z09 Encounter for follow-up examination after completed treatment for conditions other than malignant neoplasm: Secondary | ICD-10-CM

## 2016-02-26 DIAGNOSIS — Z23 Encounter for immunization: Secondary | ICD-10-CM | POA: Diagnosis not present

## 2016-02-26 DIAGNOSIS — K922 Gastrointestinal hemorrhage, unspecified: Secondary | ICD-10-CM

## 2016-02-26 NOTE — Progress Notes (Signed)
Name: Victoria Woods   MRN: [id number]    DOB: 08-Aug-1921   Date:02/26/2016       Progress Note  Subjective  Chief Complaint  Chief Complaint  Patient presents with  . Follow-up    d/c from hosp. on 27th- Dx. with UTI, [facility]    HPI Comments: Patient presents for followup from admission.   No problem-specific assessment & plan notes found for this encounter.   Past Medical History  Diagnosis Date  . Anemia   . Dementia   . Falls     Past Surgical History  Procedure Laterality Date  . Appendectomy    . Vaginal hysterectomy    . Tonsillectomy    . Partial hip arthroplasty Left     History reviewed. No pertinent family history.  Social History   Social History  . Marital Status: Widowed    Spouse Name: N/A  . Number of Children: N/A  . Years of Education: N/A   Occupational History  . Not on file.   Social History Main Topics  . Smoking status: Former Games developermoker  . Smokeless tobacco: Not on file     Comment: quit several years ago  . Alcohol Use: No  . Drug Use: No  . Sexual Activity: Not on file   Other Topics Concern  . Not on file   Social History Narrative   Lives at home, has a walker- forgets to use it. Falls lately   Has 24/7 care givers    No Known Allergies   Review of Systems  Constitutional: Negative for fever, chills, weight loss and malaise/fatigue.  HENT: Negative for ear discharge, ear pain and sore throat.   Eyes: Negative for blurred vision.  Respiratory: Negative for cough, sputum production, shortness of breath and wheezing.   Cardiovascular: Negative for chest pain, palpitations and leg swelling.  Gastrointestinal: Negative for heartburn, nausea, abdominal pain, diarrhea, constipation, blood in stool and melena.  Genitourinary: Negative for dysuria, urgency, frequency and hematuria.  Musculoskeletal: Negative for myalgias, back pain, joint pain and neck pain.  Skin: Negative for rash.  Neurological: Negative  for dizziness, tingling, sensory change, focal weakness and headaches.  Endo/Heme/Allergies: Negative for environmental allergies and polydipsia. Does not bruise/bleed easily.  Psychiatric/Behavioral: Negative for depression and suicidal ideas. The patient is not nervous/anxious and does not have insomnia.      Objective  Filed Vitals:   02/26/16 1009  BP: 110/64  Pulse: 64    Physical Exam  Constitutional: She is well-developed, well-nourished, and in no distress. Vital signs are normal. She appears to not be writhing in pain. No distress.  malaise  HENT:  Head: Normocephalic. Head is with contusion.  Right Ear: Tympanic membrane and external ear normal.  Left Ear: Tympanic membrane and external ear normal.  Nose: Nose normal.  Mouth/Throat: Oropharynx is clear and moist.  Eyes: Conjunctivae and EOM are normal. Pupils are equal, round, and reactive to light. Right eye exhibits no discharge. Left eye exhibits no discharge.  Neck: Normal range of motion. Neck supple. No JVD present. No thyromegaly present.  Cardiovascular: Normal rate, regular rhythm, normal heart sounds and intact distal pulses.  Exam reveals no gallop and no friction rub.   No murmur heard. Pulmonary/Chest: Effort normal and breath sounds normal.  Abdominal: Soft. Bowel sounds are normal. She exhibits no mass. There is no tenderness. There is no guarding.  Musculoskeletal: Normal range of motion. She exhibits no edema.  Lymphadenopathy:  She has no cervical adenopathy.  Neurological: She is alert.  Skin: Skin is warm and dry. Ecchymosis noted. She is not diaphoretic.  Psychiatric: Mood and affect normal.      Assessment & Plan  Problem List Items Addressed This Visit    None    Visit Diagnoses    Hospital discharge follow-up    -  Primary    Upper GI bleed        uncertain/ will send hemoccult    Need for pneumococcal vaccination        Relevant Orders    Pneumococcal conjugate vaccine 13-valent  (Completed)         Dr. Hayden [facility]  02/26/2016

## 2016-02-27 ENCOUNTER — Other Ambulatory Visit (INDEPENDENT_AMBULATORY_CARE_PROVIDER_SITE_OTHER): Payer: Medicare Other

## 2016-02-27 DIAGNOSIS — D649 Anemia, unspecified: Secondary | ICD-10-CM

## 2016-02-27 LAB — HEMOCCULT GUIAC POC 1CARD (OFFICE)
Card #2 Fecal Occult Blod, POC: NEGATIVE
Card #3 Fecal Occult Blood, POC: NEGATIVE
Fecal Occult Blood, POC: NEGATIVE

## 2016-03-25 ENCOUNTER — Other Ambulatory Visit: Payer: Self-pay

## 2016-04-07 ENCOUNTER — Ambulatory Visit: Payer: Medicare Other | Admitting: Family Medicine

## 2016-04-13 ENCOUNTER — Telehealth: Payer: Self-pay

## 2016-04-13 NOTE — Telephone Encounter (Signed)
PT notified

## 2016-04-13 NOTE — Telephone Encounter (Signed)
Home health called, they want a verbal to continue PT  Once a week x two weeks is this ok?

## 2016-04-23 ENCOUNTER — Ambulatory Visit (INDEPENDENT_AMBULATORY_CARE_PROVIDER_SITE_OTHER): Payer: Medicare Other | Admitting: Family Medicine

## 2016-04-23 ENCOUNTER — Encounter: Payer: Self-pay | Admitting: Family Medicine

## 2016-04-23 VITALS — BP 102/62 | HR 78 | Temp 97.7°F

## 2016-04-23 DIAGNOSIS — N3000 Acute cystitis without hematuria: Secondary | ICD-10-CM

## 2016-04-23 MED ORDER — AMPICILLIN 500 MG PO CAPS: 500.0000 mg | ORAL_CAPSULE | Freq: Three times a day (TID) | ORAL | 0 refills | Status: AC

## 2016-04-23 NOTE — Progress Notes (Signed)
Name: Victoria Woods   MRN: [id number]    DOB: [birth year]   Date:04/23/2016       Progress Note  Subjective  Chief Complaint  Chief Complaint  Patient presents with  . Urinary Tract Infection    frequent urination and the feeling of "needing to go, but can't"    Urinary Tract Infection   This is a new problem. The current episode started in the past 7 days. The problem occurs intermittently. The problem has been gradually worsening. The quality of the pain is described as aching. There has been no fever. Associated symptoms include frequency.    No problem-specific Assessment & Plan notes found for this encounter.   Past Medical History:  Diagnosis Date  . Anemia   . Dementia   . Falls     Past Surgical History:  Procedure Laterality Date  . APPENDECTOMY    . PARTIAL HIP ARTHROPLASTY Left   . TONSILLECTOMY    . VAGINAL HYSTERECTOMY      History reviewed. No pertinent family history.  Social History   Social History  . Marital status: Widowed    Spouse name: N/A  . Number of children: N/A  . Years of education: N/A   Occupational History  . Not on file.   Social History Main Topics  . Smoking status: Former Games developer  . Smokeless tobacco: Never Used     Comment: quit several years ago  . Alcohol use No  . Drug use: No  . Sexual activity: No   Other Topics Concern  . Not on file   Social History Narrative   Lives at home, has a walker- forgets to use it. Falls lately   Has 24/7 care givers    No Known Allergies   Review of Systems  Genitourinary: Positive for frequency.     Objective  Vitals:   04/23/16 1626  BP: 102/62  Pulse: 78  Temp: 97.7 F (36.5 C)  TempSrc: Oral    Physical Exam  Nursing note and vitals reviewed.     Assessment & Plan  Problem List Items Addressed This Visit    None    Visit Diagnoses   None.       Dr. Hayden [facility]  04/23/16

## 2016-04-23 NOTE — Progress Notes (Signed)
Name: Victoria Woods   MRN: [id number]    DOB: [birth year]   Date:04/23/2016       Progress Note  Subjective  Chief Complaint  Chief Complaint  Patient presents with  . Urinary Tract Infection    frequent urination and the feeling of "needing to go, but can't"    Urinary Tract Infection   This is a new problem. The current episode started in the past 7 days. The problem has been rapidly worsening. The patient is experiencing no pain. There has been no fever. Pertinent negatives include no chills, discharge, flank pain, frequency, hematuria, hesitancy, nausea, sweats, urgency or vomiting. She has tried nothing for the symptoms. The treatment provided no relief.    No problem-specific Assessment & Plan notes found for this encounter.   Past Medical History:  Diagnosis Date  . Anemia   . Dementia   . Falls     Past Surgical History:  Procedure Laterality Date  . APPENDECTOMY    . PARTIAL HIP ARTHROPLASTY Left   . TONSILLECTOMY    . VAGINAL HYSTERECTOMY      History reviewed. No pertinent family history.  Social History   Social History  . Marital status: Widowed    Spouse name: N/A  . Number of children: N/A  . Years of education: N/A   Occupational History  . Not on file.   Social History Main Topics  . Smoking status: Former Games developer  . Smokeless tobacco: Never Used     Comment: quit several years ago  . Alcohol use No  . Drug use: No  . Sexual activity: No   Other Topics Concern  . Not on file   Social History Narrative   Lives at home, has a walker- forgets to use it. Falls lately   Has 24/7 care givers    No Known Allergies   Review of Systems  Constitutional: Negative for chills, fever, malaise/fatigue and weight loss.  HENT: Negative for ear discharge, ear pain and sore throat.   Eyes: Negative for blurred vision.  Respiratory: Negative for cough, sputum production, shortness of breath and wheezing.   Cardiovascular: Negative for chest pain,  palpitations and leg swelling.  Gastrointestinal: Negative for abdominal pain, blood in stool, constipation, diarrhea, heartburn, melena, nausea and vomiting.  Genitourinary: Negative for dysuria, flank pain, frequency, hematuria, hesitancy and urgency.  Musculoskeletal: Negative for back pain, joint pain, myalgias and neck pain.  Skin: Negative for rash.  Neurological: Negative for dizziness, tingling, sensory change, focal weakness and headaches.  Endo/Heme/Allergies: Negative for environmental allergies and polydipsia. Does not bruise/bleed easily.  Psychiatric/Behavioral: Negative for depression and suicidal ideas. The patient is not nervous/anxious and does not have insomnia.      Objective  Vitals:   04/23/16 1626  BP: 102/62  Pulse: 78  Temp: 97.7 F (36.5 C)  TempSrc: Oral    Physical Exam  Constitutional: She is well-developed, well-nourished, and in no distress. No distress.  HENT:  Head: Normocephalic and atraumatic.  Right Ear: External ear normal.  Left Ear: External ear normal.  Nose: Nose normal.  Mouth/Throat: Oropharynx is clear and moist.  Eyes: Conjunctivae and EOM are normal. Pupils are equal, round, and reactive to light. Right eye exhibits no discharge. Left eye exhibits no discharge.  Neck: Normal range of motion. Neck supple. No JVD present. No thyromegaly present.  Cardiovascular: Normal rate, regular rhythm, normal heart sounds and intact distal pulses.  Exam reveals no gallop and no friction rub.  No murmur heard. Pulmonary/Chest: Effort normal and breath sounds normal.  Abdominal: Soft. Bowel sounds are normal. She exhibits no mass. There is no tenderness. There is no guarding.  Musculoskeletal: Normal range of motion. She exhibits no edema.  Lymphadenopathy:    She has no cervical adenopathy.  Neurological: She is alert. She has normal reflexes.  Skin: Skin is warm and dry. She is not diaphoretic.  Psychiatric: Mood and affect normal.  Nursing  note and vitals reviewed.     Assessment & Plan  Problem List Items Addressed This Visit    None    Visit Diagnoses    Acute cystitis without hematuria    -  Primary        Dr. Elizabeth Sauer [facility]  04/23/16

## 2016-05-08 ENCOUNTER — Other Ambulatory Visit: Payer: Self-pay

## 2016-05-20 ENCOUNTER — Emergency Department
Admission: EM | Admit: 2016-05-20 | Discharge: 2016-05-20 | Disposition: A | Discharge status: Home / Self Care | Attending: Emergency Medicine | Admitting: Emergency Medicine

## 2016-05-20 ENCOUNTER — Emergency Department

## 2016-05-20 ENCOUNTER — Encounter: Payer: Self-pay | Admitting: Emergency Medicine

## 2016-05-20 DIAGNOSIS — W1839XA Other fall on same level, initial encounter: Secondary | ICD-10-CM | POA: Insufficient documentation

## 2016-05-20 DIAGNOSIS — Y92239 Unspecified place in hospital as the place of occurrence of the external cause: Secondary | ICD-10-CM | POA: Insufficient documentation

## 2016-05-20 DIAGNOSIS — S20211A Contusion of right front wall of thorax, initial encounter: Secondary | ICD-10-CM | POA: Insufficient documentation

## 2016-05-20 DIAGNOSIS — Z79899 Other long term (current) drug therapy: Secondary | ICD-10-CM | POA: Diagnosis not present

## 2016-05-20 DIAGNOSIS — Y939 Activity, unspecified: Secondary | ICD-10-CM | POA: Diagnosis not present

## 2016-05-20 DIAGNOSIS — S299XXA Unspecified injury of thorax, initial encounter: Secondary | ICD-10-CM | POA: Diagnosis present

## 2016-05-20 DIAGNOSIS — Z87891 Personal history of nicotine dependence: Secondary | ICD-10-CM | POA: Diagnosis not present

## 2016-05-20 DIAGNOSIS — Y999 Unspecified external cause status: Secondary | ICD-10-CM | POA: Insufficient documentation

## 2016-05-20 HISTORY — DX: Trigeminal neuralgia: G50.0

## 2016-05-20 MED ORDER — OXYCODONE-ACETAMINOPHEN 5-325 MG PO TABS: 1.0000 | ORAL_TABLET | Freq: Three times a day (TID) | ORAL | 0 refills | Status: AC | PRN

## 2016-05-20 MED ORDER — FENTANYL CITRATE (PF) 100 MCG/2ML IJ SOLN
50.0000 ug | Freq: Once | INTRAMUSCULAR | Status: AC
  Administered 2016-05-20: 50 ug via INTRAVENOUS
  Filled 2016-05-20: qty 2

## 2016-05-20 MED ORDER — OXYCODONE-ACETAMINOPHEN 5-325 MG PO TABS
1.0000 | ORAL_TABLET | Freq: Once | ORAL | Status: AC
  Administered 2016-05-20: 1 via ORAL
  Filled 2016-05-20: qty 1

## 2016-05-20 NOTE — ED Notes (Signed)
Md notified of spO2 dropping after fentanyl was given.  Patient placed on 2 L [place].

## 2016-05-20 NOTE — ED Notes (Signed)
MD notified of patient discomfort.  MD explained he will make rounds on patient and update the family.

## 2016-05-20 NOTE — ED Provider Notes (Signed)
[facility] Emergency Department Provider Note  ____________________________________________   I have reviewed the triage vital signs and the nursing notes.   HISTORY  Chief Complaint Fall and Abdominal Pain    HPI Victoria Woods is a 82 y.o. female  who was on hospice with a home hospice nurse. She fell, did not hit her head. Some old with with her and actually fell with her and they were able to witness her fall. The family does not wish much done patient has a history of agitation which is recurrent and chronic, and she is somewhat agitated right now but at her baseline. The patient cannot give much of a history but she does have pain in her ribs where she fell. She has no trouble breathing. She has not had any fever or cough. The family does not wish any blood work but they would like an x-ray. They understand the limitations is places upon me.    Past Medical History:  Diagnosis Date  . Anemia   . Dementia   . Falls   . Trigeminal neuralgia of right side of face     Patient Active Problem List   Diagnosis Date Noted  . Confusion 02/19/2016    Past Surgical History:  Procedure Laterality Date  . APPENDECTOMY    . PARTIAL HIP ARTHROPLASTY Left   . TONSILLECTOMY    . VAGINAL HYSTERECTOMY      Prior to Admission medications   Medication Sig Start Date End Date Taking? Authorizing Provider  ampicillin (PRINCIPEN) 500 MG capsule Take 1 capsule (500 mg total) by mouth 3 (three) times daily. 04/23/16   Duanne Limerickeanna C Jones, MD  carbamazepine (TEGRETOL) 100 MG chewable tablet Chew 200 mg by mouth 2 (two) times daily. Dr Sherryll BurgerShah 01/28/16   Historical Provider, MD  Cholecalciferol (VITAMIN D3) 1000 units CAPS Take 1,000 Units by mouth daily.    Historical Provider, MD  donepezil (ARICEPT) 10 MG tablet Take 10 mg by mouth daily. Dr Sherryll BurgerShah    Historical Provider, MD  iron polysaccharides (FERREX 150) 150 MG capsule Take 150 mg by mouth daily.  12/13/13   Historical  Provider, MD  Multiple Vitamin (MULTI VITAMIN) TABS Take 1 tablet by mouth daily.    Historical Provider, MD    Allergies Review of patient's allergies indicates no known allergies.  No family history on file.  Social History Social History  Substance Use Topics  . Smoking status: Former Games developermoker  . Smokeless tobacco: Never Used     Comment: quit several years ago  . Alcohol use No    Review of Systems Constitutional: No fever/chills Eyes: No visual changes. ENT: No sore throat. No stiff neck no neck pain Cardiovascular: Denies chest pain except for where she fell Respiratory: Denies shortness of breath. Gastrointestinal:   no vomiting.  No diarrhea.  No constipation. Genitourinary: Negative for dysuria. Musculoskeletal: Negative lower extremity swelling Skin: Negative for rash. Neurological: Negative for severe headaches, focal weakness or numbness. 10-point ROS otherwise negative.  ____________________________________________   PHYSICAL EXAM:  VITAL SIGNS: ED Triage Vitals  Enc Vitals Group     BP 05/20/16 1700 120/76     Pulse Rate 05/20/16 1700 (!) 116     Resp 05/20/16 1700 15     Temp 05/20/16 1706 98 F (36.7 C)     Temp Source 05/20/16 1706 Oral     SpO2 05/20/16 1700 97 %     Weight 05/20/16 1700 104 lb (47.2 kg)  Height 05/20/16 1700 5\' 5"  (1.651 m)     Head Circumference --      Peak Flow --      Pain Score 05/20/16 1800 Asleep     Pain Loc --      Pain Edu? --      Excl. in GC? --     Constitutional: Alert and oriented to name only, however does giving her maiden name she is somewhat anxious but in no acute medical distress.  Eyes: Conjunctivae are normal. PERRL. EOMI. Head: Atraumatic. Nose: No congestion/rhinnorhea. Mouth/Throat: Mucous membranes are moist.  Oropharynx non-erythematous. Neck: No stridor.   Nontender with no meningismus Cardiovascular: Normal rate, regular rhythm. Grossly normal heart sounds.  Good peripheral  circulation. Respiratory: Normal respiratory effort.  No retractions. Lungs CTAB. Chest: Tender to palpation in the right chest wall this reduced her pain no flail chest, shows the patient's thought in the ribs in the midaxillary line she states "ouch that's the pain right there" and pulls back. No crepitus noted. Abdominal: Soft and nontender. No distention. No guarding no rebound is severely no tenderness palpation in the right upper quadrant Back:  There is no focal tenderness or step off.  there is no midline tenderness there are no lesions noted. there is no CVA tenderness Musculoskeletal: No lower extremity tenderness, no upper extremity tenderness. No joint effusions, no DVT signs strong distal pulses no edema Neurologic:  Normal speech and language. No gross focal neurologic deficits are appreciated.   ____________________________________________   LABS (all labs ordered are listed, but only abnormal results are displayed)  Labs Reviewed - No data to display ____________________________________________  EKG  I personally interpreted any EKGs ordered by me or triage  ____________________________________________  RADIOLOGY  I reviewed any imaging ordered by me or triage that were performed during my shift and, if possible, patient and/or family made aware of any abnormal findings. ____________________________________________   PROCEDURES  Procedure(s) performed: None  Procedures  Critical Care performed: None  ____________________________________________   INITIAL IMPRESSION / ASSESSMENT AND PLAN / ED COURSE  Pertinent labs & imaging results that were available during my care of the patient were reviewed by me and considered in my medical decision making (see chart for details).  Patient here after a fall with some pain in her ribs. She is agitated and upset. Family states they would feel much better if she is at home. We gave her fentanyl and she was much more pain  control however it did make her sleepy and we put her on 2 L of oxygen. This time she is awake and alert. She is agitated. She wants the IV out and she wants to go home. I splinted the family that I'm happy to do further investigation including blood work and I could even admit her for pain control and they would prefer to go home. I did also explain that although at this time clinically there is no evidence of a pneumonia she certainly is at risk for her she continues to splint although she is not currently splinting at this moment, and they understand this. Chest x-ray shows what appears to be atelectatic changes. We will defer antibiotics at this time. She'll be watched closely by home hospice nurse every day and they will stay as long with her as needed. I did tell them to look For possible pneumonia. We'll send the patient home with pain medication to try to forestall possible splinting associated pneumonia, we will have them watch her closely  on this medication as it is somewhat strong. However, the patient is already taking Toradol and not getting relief. This is a very reproducible chest wall pain and I do not see evidence of pneumothorax noted or see any evidence of intra-abdominal pathology. The patient and family, including her power of attorney daughter named Lynnell Dike, are very comfortable with this plan and are requesting immediate discharge at this time. They understand that they can return at any time and I feel that this is not an unreasonable supposition from their point of view.  Clinical Course   ____________________________________________   FINAL CLINICAL IMPRESSION(S) / ED DIAGNOSES  Final diagnoses:  None      This chart was dictated using voice recognition software.  Despite best efforts to proofread,  errors can occur which can change meaning.      Jeanmarie Plant, MD 05/20/16 [phone]

## 2016-05-20 NOTE — ED Triage Notes (Signed)
Pt comes into the ED via EMS from home.  Patient is a hospice patient who fell earlier today with her hospice nurse.  Patient c/o RUQ rib pain and abdominal pain.  Patient has severe dementia and has been falling multiple times this past weekend.  VS 98/70, 120 HR, 96 RA.

## 2016-05-29 DEATH — deceased

## 2017-01-05 IMAGING — CR DG CHEST 2V
2 series · 2 of 2 positions shown · non-contrast
Comparison: 02/22/2016

CLINICAL DATA: Right-sided chest wall pain.

EXAM:
CHEST  2 VIEW

[chest lat]
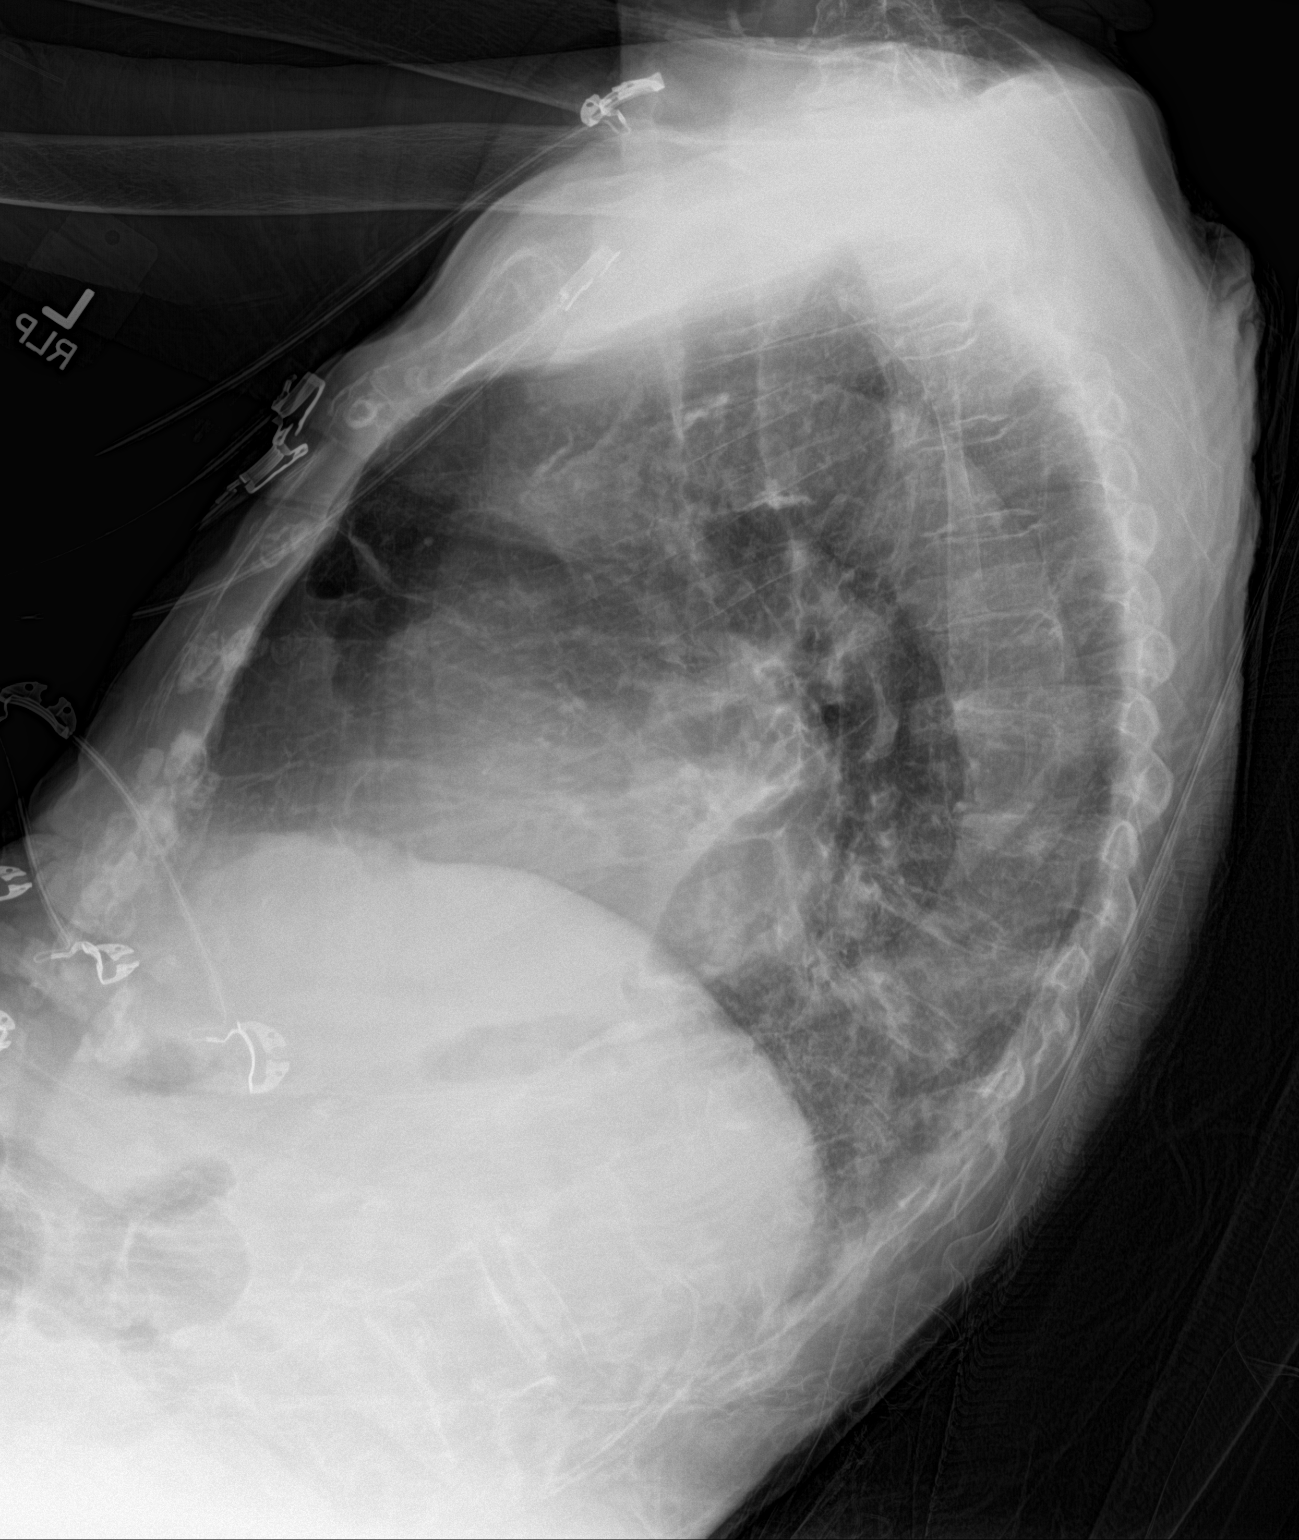

[chest ap]
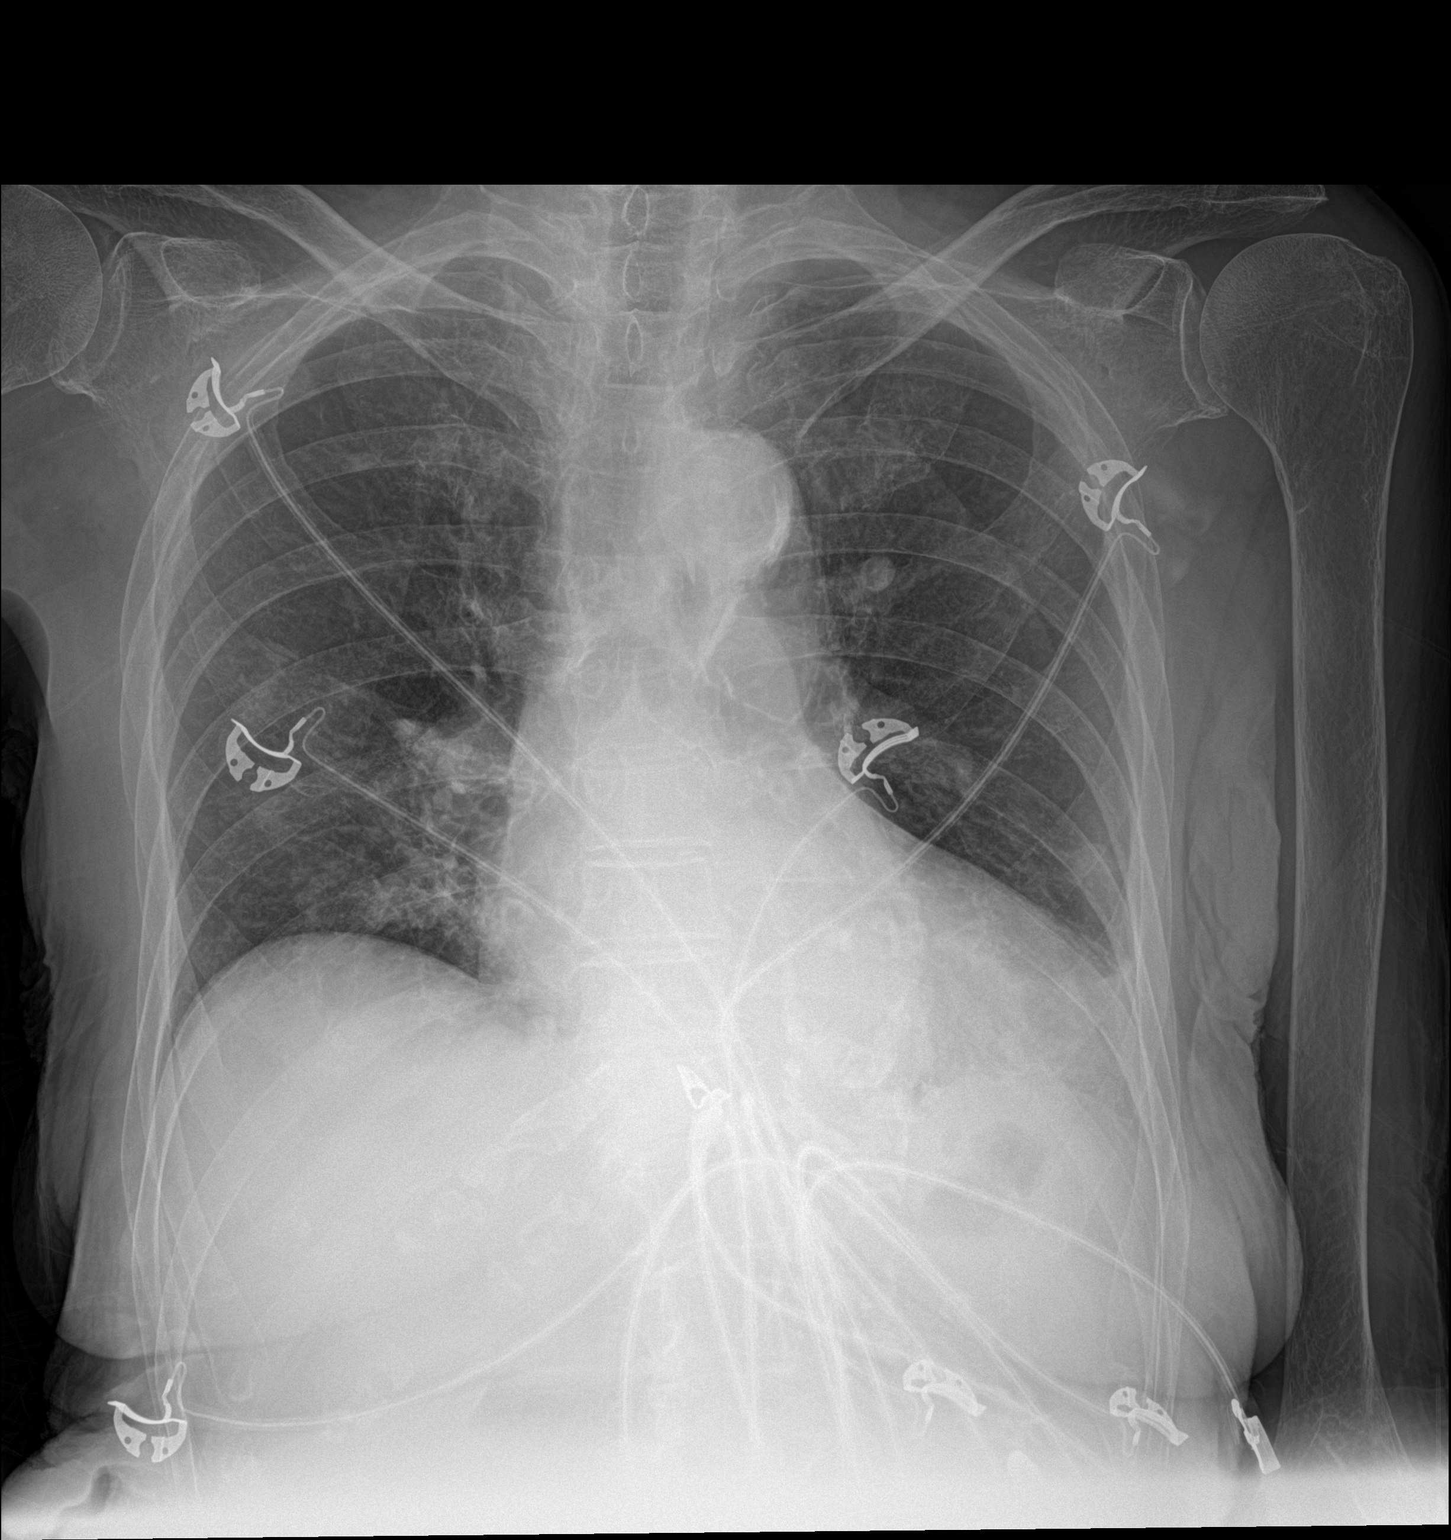

[2 of 2 positions shown; findings below may reference images not displayed]

FINDINGS: The heart size is normal. Stable calcification and tortuosity of the
thoracic aorta. Density in the right perihilar lung may represent
subtle infiltrate versus atelectasis. No edema or pleural fluid
identified. The visualized skeletal structures are unremarkable.
IMPRESSION: Right perihilar infiltrate versus atelectasis.
